# Patient Record
Sex: Female | Born: 1965 | Race: White | Hispanic: No | Marital: Married | Smoking: Never smoker
Health system: Southern US, Community
[De-identification: ages and names within clinical notes are randomized; demographics above are authoritative.]

## PROBLEM LIST (undated history)

## (undated) DIAGNOSIS — I10 Essential (primary) hypertension: Secondary | ICD-10-CM

## (undated) DIAGNOSIS — D68318 Other hemorrhagic disorder due to intrinsic circulating anticoagulants, antibodies, or inhibitors: Secondary | ICD-10-CM

## (undated) DIAGNOSIS — K146 Glossodynia: Secondary | ICD-10-CM

## (undated) HISTORY — DX: Other hemorrhagic disorder due to intrinsic circulating anticoagulants, antibodies, or inhibitors: D68.318

## (undated) HISTORY — DX: Essential (primary) hypertension: I10

## (undated) HISTORY — DX: Glossodynia: K14.6

---

## 2005-12-02 ENCOUNTER — Ambulatory Visit: Payer: Self-pay | Admitting: Unknown Physician Specialty

## 2007-03-01 HISTORY — PX: ABDOMINAL HYSTERECTOMY: SHX81

## 2007-04-01 ENCOUNTER — Ambulatory Visit: Payer: Self-pay | Admitting: Internal Medicine

## 2007-04-24 ENCOUNTER — Ambulatory Visit: Payer: Self-pay | Admitting: Unknown Physician Specialty

## 2007-04-27 ENCOUNTER — Ambulatory Visit: Payer: Self-pay | Admitting: Internal Medicine

## 2007-04-29 ENCOUNTER — Ambulatory Visit: Payer: Self-pay | Admitting: Internal Medicine

## 2007-05-01 ENCOUNTER — Inpatient Hospital Stay: Payer: Self-pay | Admitting: Unknown Physician Specialty

## 2007-05-05 ENCOUNTER — Observation Stay: Payer: Self-pay | Admitting: Internal Medicine

## 2007-05-30 ENCOUNTER — Ambulatory Visit: Payer: Self-pay | Admitting: Internal Medicine

## 2007-11-27 ENCOUNTER — Ambulatory Visit: Payer: Self-pay | Admitting: Unknown Physician Specialty

## 2008-12-29 ENCOUNTER — Ambulatory Visit: Payer: Self-pay | Admitting: Unknown Physician Specialty

## 2009-03-18 DIAGNOSIS — L989 Disorder of the skin and subcutaneous tissue, unspecified: Secondary | ICD-10-CM | POA: Insufficient documentation

## 2009-03-18 DIAGNOSIS — D6800 Von Willebrand disease, unspecified: Secondary | ICD-10-CM | POA: Insufficient documentation

## 2009-03-18 DIAGNOSIS — M25559 Pain in unspecified hip: Secondary | ICD-10-CM | POA: Insufficient documentation

## 2009-07-17 DIAGNOSIS — R5381 Other malaise: Secondary | ICD-10-CM | POA: Insufficient documentation

## 2009-07-23 DIAGNOSIS — E559 Vitamin D deficiency, unspecified: Secondary | ICD-10-CM | POA: Insufficient documentation

## 2009-07-31 DIAGNOSIS — F419 Anxiety disorder, unspecified: Secondary | ICD-10-CM | POA: Insufficient documentation

## 2009-12-31 ENCOUNTER — Ambulatory Visit: Payer: Self-pay | Admitting: Unknown Physician Specialty

## 2011-01-19 ENCOUNTER — Ambulatory Visit: Payer: Self-pay | Admitting: Unknown Physician Specialty

## 2015-10-06 DIAGNOSIS — R739 Hyperglycemia, unspecified: Secondary | ICD-10-CM | POA: Insufficient documentation

## 2016-10-06 ENCOUNTER — Other Ambulatory Visit: Payer: Self-pay | Admitting: Internal Medicine

## 2016-10-06 DIAGNOSIS — Z1231 Encounter for screening mammogram for malignant neoplasm of breast: Secondary | ICD-10-CM

## 2016-10-25 ENCOUNTER — Ambulatory Visit
Admission: RE | Admit: 2016-10-25 | Discharge: 2016-10-25 | Disposition: A | Discharge status: Home / Self Care | Payer: 59 | Source: Ambulatory Visit | Attending: Internal Medicine | Admitting: Internal Medicine

## 2016-10-25 DIAGNOSIS — Z1231 Encounter for screening mammogram for malignant neoplasm of breast: Secondary | ICD-10-CM | POA: Diagnosis present

## 2016-11-02 ENCOUNTER — Inpatient Hospital Stay
Admission: RE | Admit: 2016-11-02 | Discharge: 2016-11-02 | Disposition: A | Discharge status: Home / Self Care | Payer: Self-pay | Source: Ambulatory Visit | Attending: *Deleted | Admitting: *Deleted

## 2016-11-02 ENCOUNTER — Other Ambulatory Visit: Payer: Self-pay | Admitting: *Deleted

## 2016-11-02 DIAGNOSIS — Z9289 Personal history of other medical treatment: Secondary | ICD-10-CM

## 2017-12-04 ENCOUNTER — Other Ambulatory Visit: Payer: Self-pay | Admitting: Internal Medicine

## 2017-12-04 DIAGNOSIS — Z1231 Encounter for screening mammogram for malignant neoplasm of breast: Secondary | ICD-10-CM

## 2017-12-05 ENCOUNTER — Ambulatory Visit
Admission: RE | Admit: 2017-12-05 | Discharge: 2017-12-05 | Disposition: A | Discharge status: Home / Self Care | Payer: 59 | Source: Ambulatory Visit | Attending: Internal Medicine | Admitting: Internal Medicine

## 2017-12-05 DIAGNOSIS — Z1231 Encounter for screening mammogram for malignant neoplasm of breast: Secondary | ICD-10-CM | POA: Insufficient documentation

## 2018-11-28 ENCOUNTER — Other Ambulatory Visit: Payer: Self-pay | Admitting: Internal Medicine

## 2018-11-28 DIAGNOSIS — Z1231 Encounter for screening mammogram for malignant neoplasm of breast: Secondary | ICD-10-CM

## 2019-01-03 ENCOUNTER — Ambulatory Visit
Admission: RE | Admit: 2019-01-03 | Discharge: 2019-01-03 | Disposition: A | Discharge status: Home / Self Care | Payer: Managed Care, Other (non HMO) | Source: Ambulatory Visit | Attending: Internal Medicine | Admitting: Internal Medicine

## 2019-01-03 DIAGNOSIS — Z1231 Encounter for screening mammogram for malignant neoplasm of breast: Secondary | ICD-10-CM | POA: Insufficient documentation

## 2019-12-06 ENCOUNTER — Other Ambulatory Visit: Payer: Self-pay | Admitting: Internal Medicine

## 2019-12-06 DIAGNOSIS — Z1231 Encounter for screening mammogram for malignant neoplasm of breast: Secondary | ICD-10-CM

## 2020-01-01 ENCOUNTER — Inpatient Hospital Stay: Payer: Managed Care, Other (non HMO)

## 2020-01-01 ENCOUNTER — Inpatient Hospital Stay: Payer: Managed Care, Other (non HMO) | Attending: Oncology | Admitting: Oncology

## 2020-01-01 ENCOUNTER — Other Ambulatory Visit: Payer: Self-pay

## 2020-01-01 ENCOUNTER — Encounter: Payer: Self-pay | Admitting: Oncology

## 2020-01-01 DIAGNOSIS — Z79899 Other long term (current) drug therapy: Secondary | ICD-10-CM | POA: Insufficient documentation

## 2020-01-01 DIAGNOSIS — D751 Secondary polycythemia: Secondary | ICD-10-CM | POA: Insufficient documentation

## 2020-01-01 DIAGNOSIS — D680 Von Willebrand's disease: Secondary | ICD-10-CM | POA: Diagnosis not present

## 2020-01-01 DIAGNOSIS — Z803 Family history of malignant neoplasm of breast: Secondary | ICD-10-CM | POA: Insufficient documentation

## 2020-01-01 DIAGNOSIS — I10 Essential (primary) hypertension: Secondary | ICD-10-CM | POA: Insufficient documentation

## 2020-01-01 DIAGNOSIS — Z8041 Family history of malignant neoplasm of ovary: Secondary | ICD-10-CM | POA: Diagnosis not present

## 2020-01-01 LAB — CBC WITH DIFFERENTIAL/PLATELET
Abs Immature Granulocytes: 0.02 10*3/uL (ref 0.00–0.07)
Basophils Absolute: 0.1 10*3/uL (ref 0.0–0.1)
Basophils Relative: 1 %
Eosinophils Absolute: 0 10*3/uL (ref 0.0–0.5)
Eosinophils Relative: 0 %
HCT: 43.4 % (ref 36.0–46.0)
Hemoglobin: 15.7 g/dL — ABNORMAL HIGH (ref 12.0–15.0)
Immature Granulocytes: 0 %
Lymphocytes Relative: 31 %
Lymphs Abs: 2.1 10*3/uL (ref 0.7–4.0)
MCH: 32 pg (ref 26.0–34.0)
MCHC: 36.2 g/dL — ABNORMAL HIGH (ref 30.0–36.0)
MCV: 88.6 fL (ref 80.0–100.0)
Monocytes Absolute: 0.5 10*3/uL (ref 0.1–1.0)
Monocytes Relative: 7 %
Neutro Abs: 4.1 10*3/uL (ref 1.7–7.7)
Neutrophils Relative %: 61 %
Platelets: 184 10*3/uL (ref 150–400)
RBC: 4.9 MIL/uL (ref 3.87–5.11)
RDW: 11.9 % (ref 11.5–15.5)
WBC: 6.9 10*3/uL (ref 4.0–10.5)
nRBC: 0 % (ref 0.0–0.2)

## 2020-01-01 LAB — IRON AND TIBC
Iron: 99 ug/dL (ref 28–170)
Saturation Ratios: 34 % — ABNORMAL HIGH (ref 10.4–31.8)
TIBC: 290 ug/dL (ref 250–450)
UIBC: 191 ug/dL

## 2020-01-01 LAB — HEPATIC FUNCTION PANEL
ALT: 26 U/L (ref 0–44)
AST: 21 U/L (ref 15–41)
Albumin: 4.5 g/dL (ref 3.5–5.0)
Alkaline Phosphatase: 65 U/L (ref 38–126)
Bilirubin, Direct: 0.2 mg/dL (ref 0.0–0.2)
Indirect Bilirubin: 0.4 mg/dL (ref 0.3–0.9)
Total Bilirubin: 0.6 mg/dL (ref 0.3–1.2)
Total Protein: 8 g/dL (ref 6.5–8.1)

## 2020-01-01 LAB — HEPATITIS PANEL, ACUTE
HCV Ab: NONREACTIVE
Hep A IgM: NONREACTIVE
Hep B C IgM: NONREACTIVE
Hepatitis B Surface Ag: NONREACTIVE

## 2020-01-01 LAB — FERRITIN: Ferritin: 143 ng/mL (ref 11–307)

## 2020-01-01 NOTE — Progress Notes (Signed)
Hematology/Oncology Consult note [facility] Telephone:([phone] [phone]   Patient Care Team: Lynnea Ferrier, MD as PCP - General (Internal Medicine)  REFERRING PROVIDER: Lynnea Ferrier, MD  CHIEF COMPLAINTS/REASON FOR VISIT:  Evaluation of hereditary hemochromatosis  HISTORY OF PRESENTING ILLNESS:  Holly Mcclain is a 54 y.o. female who was seen in consultation at the request of Lynnea Ferrier, MD for evaluation of hemochromatosis Patient recent blood work showed elevated hemoglobin. 12/17/2019 hemochromatosis panel was ordered for work-up Results was positive for 2 copies of the C282Y mutation Ferritin 132, iron 144 patient was referred to establish care with hematology for further evaluation. She denies any alcohol use, skin rash, abdominal pain, shortness of breath, chest pain, leg swelling  Patient reports history of mild version of von Willebrand disease and was previously seen by Dr. Nelida Gores and was discharged.  Status post hysterectomy in 2009 Family history is positive for breast cancer and ovarian cancer in maternal grandmother.  No other family history of cancer.  Review of Systems  Constitutional: Negative for appetite change, chills, fatigue and fever.  HENT:   Negative for hearing loss and voice change.   Eyes: Negative for eye problems.  Respiratory: Negative for chest tightness and cough.   Cardiovascular: Negative for chest pain.  Gastrointestinal: Negative for abdominal distention, abdominal pain and blood in stool.  Endocrine: Negative for hot flashes.  Genitourinary: Negative for difficulty urinating and frequency.   Musculoskeletal: Negative for arthralgias.  Skin: Negative for itching and rash.  Neurological: Negative for extremity weakness.  Hematological: Negative for adenopathy.  Psychiatric/Behavioral: Negative for confusion.    MEDICAL HISTORY:  Past Medical History:  Diagnosis Date  . Burning mouth  syndrome   . Hypertension   . Von Willebrand factor inhibitor disorder (HCC)     SURGICAL HISTORY: Past Surgical History:  Procedure Laterality Date  . ABDOMINAL HYSTERECTOMY  2009    SOCIAL HISTORY: Social History   Socioeconomic History  . Marital status: Married    Spouse name: Not on file  . Number of children: Not on file  . Years of education: Not on file  . Highest education level: Not on file  Occupational History  . Not on file  Tobacco Use  . Smoking status: Never Smoker  . Smokeless tobacco: Never Used  Vaping Use  . Vaping Use: Never used  Substance and Sexual Activity  . Alcohol use: Never  . Drug use: Never  . Sexual activity: Yes  Other Topics Concern  . Not on file  Social History Narrative  . Not on file   Social Determinants of Health   Financial Resource Strain:   . Difficulty of Paying Living Expenses: Not on file  Food Insecurity:   . Worried About Programme researcher, broadcasting/film/video in the Last Year: Not on file  . Ran Out of Food in the Last Year: Not on file  Transportation Needs:   . Lack of Transportation (Medical): Not on file  . Lack of Transportation (Non-Medical): Not on file  Physical Activity:   . Days of Exercise per Week: Not on file  . Minutes of Exercise per Session: Not on file  Stress:   . Feeling of Stress : Not on file  Social Connections:   . Frequency of Communication with Friends and Family: Not on file  . Frequency of Social Gatherings with Friends and Family: Not on file  . Attends Religious Services: Not on file  .  Active Member of Clubs or Organizations: Not on file  . Attends Banker Meetings: Not on file  . Marital Status: Not on file  Intimate Partner Violence:   . Fear of Current or Ex-Partner: Not on file  . Emotionally Abused: Not on file  . Physically Abused: Not on file  . Sexually Abused: Not on file    FAMILY HISTORY: Family History  Problem Relation Age of Onset  . Breast cancer Maternal  Grandmother        33  . Ovarian cancer Maternal Grandmother     ALLERGIES:  is allergic to aspirin.  MEDICATIONS:  Current Outpatient Medications  Medication Sig Dispense Refill  . acetaminophen (TYLENOL) 500 MG tablet Take by mouth every 6 (six) hours as needed.     . fluticasone (FLONASE) 50 MCG/ACT nasal spray Place into the nose daily as needed.     . loratadine (CLARITIN) 10 MG tablet Take by mouth daily as needed.     . triamterene-hydrochlorothiazide (MAXZIDE-25) 37.5-25 MG tablet Take 1 tablet by mouth daily.     No current facility-administered medications for this visit.     PHYSICAL EXAMINATION: ECOG PERFORMANCE STATUS: 0 - Asymptomatic Vitals:   01/01/20 1040  BP: (!) 148/84  Pulse: 77  Temp: (!) 97 F (36.1 C)  SpO2: 98%   Filed Weights   01/01/20 1040  Weight: 173 lb 4.8 oz (78.6 kg)    Physical Exam Constitutional:      General: She is not in acute distress. HENT:     Head: Normocephalic and atraumatic.  Eyes:     General: No scleral icterus. Cardiovascular:     Rate and Rhythm: Normal rate and regular rhythm.     Heart sounds: Normal heart sounds.  Pulmonary:     Effort: Pulmonary effort is normal. No respiratory distress.     Breath sounds: No wheezing.  Abdominal:     General: Bowel sounds are normal. There is no distension.     Palpations: Abdomen is soft.  Musculoskeletal:        General: No deformity. Normal range of motion.     Cervical back: Normal range of motion and neck supple.  Skin:    General: Skin is warm and dry.     Findings: No erythema or rash.  Neurological:     Mental Status: She is alert and oriented to person, place, and time. Mental status is at baseline.     Cranial Nerves: No cranial nerve deficit.     Coordination: Coordination normal.  Psychiatric:        Mood and Affect: Mood normal.      LABORATORY DATA:  I have reviewed the data as listed Lab Results  Component Value Date   WBC 6.9 01/01/2020   HGB  15.7 (H) 01/01/2020   HCT 43.4 01/01/2020   MCV 88.6 01/01/2020   PLT 184 01/01/2020   Recent Labs    01/01/20 1142  PROT 8.0  ALBUMIN 4.5  AST 21  ALT 26  ALKPHOS 65  BILITOT 0.6  BILIDIR 0.2  IBILI 0.4   Iron/TIBC/Ferritin/ %Sat    Component Value Date/Time   IRON 99 01/01/2020 1142   TIBC 290 01/01/2020 1142   FERRITIN 143 01/01/2020 1142   IRONPCTSAT 34 (H) 01/01/2020 1142      RADIOGRAPHIC STUDIES: I have personally reviewed the radiological images as listed and agreed with the findings in the report.  No results found.   ASSESSMENT & PLAN:  1. Hereditary hemochromatosis (HCC)   2. Erythrocytosis    Diagnosis of hereditary hemochromatosis discussed with patient.  Advised patient to have first-degree relative screening for hemochromatosis.  Avoid alcohol consumption.  I will check hepatitis, repeat iron panel Recent ferritin is less than 500.  No transaminitis or elevated bilirubin.  I will hold off phlebotomy at this point. Check ultrasound abdomen right upper quadrant Monitor regularly  Erythrocytosis, check JAK2 mutation with reflex.  Addendum- Labs are reviewed and discussed with patient. JAK2 V617F mutation negative, with reflex to other mutations CALR, MPL, JAK 2 Ex 12-15 mutations negative. Erythrocytosis is most likely secondary process. Recommend sleep apnea evaluation.  Iron panel showed slightly /isolate increase of iron saturation. Ferritin level is 143. - monitor.   US findings are normal.  Follow up virtual visit to go over lab results.   Orders Placed This Encounter  Procedures  . US Abdomen Limited RUQ (LIVER/GB)    Standing Status:   Future    Standing Expiration Date:   12/31/2020    Order Specific Question:   Reason for Exam (SYMPTOM  OR DIAGNOSIS REQUIRED)    Answer:   hemochromatosis    Order Specific Question:   Preferred imaging location?    Answer:   [facility]  . CBC with Differential/Platelet    Standing Status:    Future    Number of Occurrences:   1    Standing Expiration Date:   12/31/2020  . Ferritin    Standing Status:   Future    Number of Occurrences:   1    Standing Expiration Date:   06/30/2020  . Iron and TIBC    Standing Status:   Future    Number of Occurrences:   1    Standing Expiration Date:   12/31/2020  . JAK2 V617F, w Reflex to CALR/E12/MPL    Standing Status:   Future    Number of Occurrences:   1    Standing Expiration Date:   12/31/2020  . Hepatitis panel, acute    Standing Status:   Future    Number of Occurrences:   1    Standing Expiration Date:   12/31/2020  . Hepatic function panel    Standing Status:   Future    Number of Occurrences:   1    Standing Expiration Date:   12/31/2020    All questions were answered. The patient knows to call the clinic with any problems questions or concerns.  Cc Lynnea Ferrier, MD   Thank you for this kind referral and the opportunity to participate in the care of this patient. A copy of today's note is routed to referring provider     Rickard Patience, MD, PhD Hematology Oncology [facility] Pager- [phone] 01/01/2020

## 2020-01-01 NOTE — Progress Notes (Signed)
New patient evaluation.   

## 2020-01-07 ENCOUNTER — Ambulatory Visit
Admission: RE | Admit: 2020-01-07 | Discharge: 2020-01-07 | Disposition: A | Discharge status: Home / Self Care | Payer: Managed Care, Other (non HMO) | Source: Ambulatory Visit | Attending: Oncology | Admitting: Oncology

## 2020-01-07 ENCOUNTER — Other Ambulatory Visit: Payer: Self-pay

## 2020-01-08 ENCOUNTER — Telehealth: Payer: Self-pay

## 2020-01-08 ENCOUNTER — Ambulatory Visit
Admission: RE | Admit: 2020-01-08 | Discharge: 2020-01-08 | Disposition: A | Discharge status: Home / Self Care | Payer: Managed Care, Other (non HMO) | Source: Ambulatory Visit | Attending: Internal Medicine | Admitting: Internal Medicine

## 2020-01-08 DIAGNOSIS — Z1231 Encounter for screening mammogram for malignant neoplasm of breast: Secondary | ICD-10-CM | POA: Diagnosis not present

## 2020-01-08 NOTE — Telephone Encounter (Signed)
Mychart message sent to pt notifying her of lab and ultrasound results.

## 2020-01-08 NOTE — Telephone Encounter (Signed)
-----   Message from Rickard Patience, MD sent at 01/08/2020  2:46 PM EST ----- Please let her know that ultrasound showed normal liver echogenicity.  No signs of cirrhosis. Iron lab showed normal ferritin, slightly elevated iron saturation.  I recommend no phlebotomy and observation for now and repeat blood work in 3 months. Please arrange for her to follow-up with labs, CBC, iron labs prior.  Please order.  MD virtually.  Thank you

## 2020-01-09 NOTE — Telephone Encounter (Signed)
Done... Lab prior in 71mths/MyChart visit appts has been  scheduled As requested. Pt was made aware.

## 2020-01-19 LAB — CALR + JAK2 E12-15 + MPL (REFLEXED)

## 2020-01-19 LAB — JAK2 V617F, W REFLEX TO CALR/E12/MPL

## 2020-01-21 ENCOUNTER — Telehealth: Payer: Self-pay

## 2020-01-21 NOTE — Telephone Encounter (Signed)
Please call pt to schedule Mychart visit one day next week.

## 2020-01-21 NOTE — Telephone Encounter (Signed)
Done... Pt has been sched for MyChart the week of 11/29 As requested. Pt is aware.

## 2020-01-21 NOTE — Telephone Encounter (Signed)
-----   Message from Rickard Patience, MD sent at 01/21/2020  1:30 AM EST ----- All labs come back. Please arrange her to have a virtual visit visit with me next week. Thanks.

## 2020-01-31 ENCOUNTER — Inpatient Hospital Stay: Payer: Managed Care, Other (non HMO) | Attending: Oncology | Admitting: Oncology

## 2020-01-31 ENCOUNTER — Encounter: Payer: Self-pay | Admitting: Oncology

## 2020-01-31 DIAGNOSIS — D751 Secondary polycythemia: Secondary | ICD-10-CM | POA: Diagnosis not present

## 2020-01-31 NOTE — Progress Notes (Signed)
Patient verified using two identifiers for virtual visit via telephone today.  Patient denies new problems/concerns today.   

## 2020-02-01 NOTE — Progress Notes (Signed)
HEMATOLOGY-ONCOLOGY TeleHEALTH VISIT PROGRESS NOTE  I connected with Holly Mcclain on 02/01/20 at  2:45 PM EST by video enabled telemedicine visit and verified that I am speaking with the correct person using two identifiers. I discussed the limitations, risks, security and privacy concerns of performing an evaluation and management service by telemedicine and the availability of in-person appointments. I also discussed with the patient that there may be a patient responsible charge related to this service. The patient expressed understanding and agreed to proceed.   Other persons participating in the visit and their role in the encounter:  None  Patient's location: Home  Provider's location: office Chief Complaint: hereditary hemochromatosis, erythrocytosis   INTERVAL HISTORY Holly Mcclain is a 54 y.o. female who has above history reviewed by me today presents for follow up visit for management of hereditary hemochromatosis/erythrocytosis Problems and complaints are listed below:  She had blood word done and presents virtually to discuss results.  No new complaints  Review of Systems  Constitutional: Negative for appetite change, chills, fatigue and fever.  HENT:   Negative for hearing loss and voice change.   Eyes: Negative for eye problems.  Respiratory: Negative for chest tightness and cough.   Cardiovascular: Negative for chest pain.  Gastrointestinal: Negative for abdominal distention, abdominal pain and blood in stool.  Endocrine: Negative for hot flashes.  Genitourinary: Negative for difficulty urinating and frequency.   Musculoskeletal: Negative for arthralgias.  Skin: Negative for itching and rash.  Neurological: Negative for extremity weakness.  Hematological: Negative for adenopathy.  Psychiatric/Behavioral: Negative for confusion.    Past Medical History:  Diagnosis Date  . Burning mouth syndrome   . Hypertension   . Von Willebrand factor inhibitor disorder [facility])     Past Surgical History:  Procedure Laterality Date  . ABDOMINAL HYSTERECTOMY  2009    Family History  Problem Relation Age of Onset  . Breast cancer Maternal Grandmother        1  . Ovarian cancer Maternal Grandmother   . Lymphoma Maternal Grandfather     Social History   Socioeconomic History  . Marital status: Married    Spouse name: Not on file  . Number of children: Not on file  . Years of education: Not on file  . Highest education level: Not on file  Occupational History  . Not on file  Tobacco Use  . Smoking status: Never Smoker  . Smokeless tobacco: Never Used  Vaping Use  . Vaping Use: Never used  Substance and Sexual Activity  . Alcohol use: Never  . Drug use: Never  . Sexual activity: Yes  Other Topics Concern  . Not on file  Social History Narrative  . Not on file   Social Determinants of Health   Financial Resource Strain:   . Difficulty of Paying Living Expenses: Not on file  Food Insecurity:   . Worried About Programme researcher, broadcasting/film/video in the Last Year: Not on file  . Ran Out of Food in the Last Year: Not on file  Transportation Needs:   . Lack of Transportation (Medical): Not on file  . Lack of Transportation (Non-Medical): Not on file  Physical Activity:   . Days of Exercise per Week: Not on file  . Minutes of Exercise per Session: Not on file  Stress:   . Feeling of Stress : Not on file  Social Connections:   . Frequency of Communication with Friends and Family: Not on file  . Frequency of  Social Gatherings with Friends and Family: Not on file  . Attends Religious Services: Not on file  . Active Member of Clubs or Organizations: Not on file  . Attends Banker Meetings: Not on file  . Marital Status: Not on file  Intimate Partner Violence:   . Fear of Current or Ex-Partner: Not on file  . Emotionally Abused: Not on file  . Physically Abused: Not on file  . Sexually Abused: Not on file    Current Outpatient Medications on File  Prior to Visit  Medication Sig Dispense Refill  . acetaminophen (TYLENOL) 500 MG tablet Take by mouth every 6 (six) hours as needed.     . fluticasone (FLONASE) 50 MCG/ACT nasal spray Place into the nose daily as needed.     . loratadine (CLARITIN) 10 MG tablet Take by mouth daily as needed.     . triamterene-hydrochlorothiazide (MAXZIDE-25) 37.5-25 MG tablet Take 1 tablet by mouth daily.     No current facility-administered medications on file prior to visit.    Allergies  Allergen Reactions  . Aspirin Other (See Comments)    Vonwillebrands Disease       Observations/Objective: Today's Vitals   01/31/20 1333  PainSc: 0-No pain   There is no height or weight on file to calculate BMI.  Physical Exam Neurological:     Mental Status: She is alert.     CBC    Component Value Date/Time   WBC 6.9 01/01/2020 1142   RBC 4.90 01/01/2020 1142   HGB 15.7 (H) 01/01/2020 1142   HCT 43.4 01/01/2020 1142   PLT 184 01/01/2020 1142   MCV 88.6 01/01/2020 1142   MCH 32.0 01/01/2020 1142   MCHC 36.2 (H) 01/01/2020 1142   RDW 11.9 01/01/2020 1142   LYMPHSABS 2.1 01/01/2020 1142   MONOABS 0.5 01/01/2020 1142   EOSABS 0.0 01/01/2020 1142   BASOSABS 0.1 01/01/2020 1142    CMP     Component Value Date/Time   PROT 8.0 01/01/2020 1142   ALBUMIN 4.5 01/01/2020 1142   AST 21 01/01/2020 1142   ALT 26 01/01/2020 1142   ALKPHOS 65 01/01/2020 1142   BILITOT 0.6 01/01/2020 1142     Assessment and Plan: 1. Erythrocytosis   2. Hereditary hemochromatosis (HCC)     #Hereditary hemochromatosis prognosis was discussed with patient.  Advise first-degree relatives screening for hemochromatosis Labs reviewed and discussed with patient.  Patient has a ferritin level of 143, slightly increased iron saturation. Normal liver function testing.  Ultrasound liver is normal.  I recommend observation for now, hold off phlebotomy.  #Erythrocytosis, JAK2 V617F mutation negative, with reflex to other  mutations CALR, MPL, JAK 2 Ex 12-15 mutations negative. Suspect secondary etiology.  Recommend patient to talk to primary care provider and have sleep apnea evaluation. Currently HCT is less than 50.  No need for urgent phlebotomy.  Follow Up Instructions: Keep currently scheduled appointment   I discussed the assessment and treatment plan with the patient. The patient was provided an opportunity to ask questions and all were answered. The patient agreed with the plan and demonstrated an understanding of the instructions.  The patient was advised to call back or seek an in-person evaluation if the symptoms worsen or if the condition fails to improve as anticipated.    Rickard Patience, MD 02/01/2020 12:09 PM

## 2020-04-03 ENCOUNTER — Inpatient Hospital Stay: Payer: Managed Care, Other (non HMO) | Attending: Oncology

## 2020-04-03 LAB — CBC WITH DIFFERENTIAL/PLATELET
Abs Immature Granulocytes: 0.03 10*3/uL (ref 0.00–0.07)
Basophils Absolute: 0 10*3/uL (ref 0.0–0.1)
Basophils Relative: 1 %
Eosinophils Absolute: 0 10*3/uL (ref 0.0–0.5)
Eosinophils Relative: 1 %
HCT: 38.9 % (ref 36.0–46.0)
Hemoglobin: 14.3 g/dL (ref 12.0–15.0)
Immature Granulocytes: 1 %
Lymphocytes Relative: 27 %
Lymphs Abs: 1.8 10*3/uL (ref 0.7–4.0)
MCH: 32.4 pg (ref 26.0–34.0)
MCHC: 36.8 g/dL — ABNORMAL HIGH (ref 30.0–36.0)
MCV: 88 fL (ref 80.0–100.0)
Monocytes Absolute: 0.5 10*3/uL (ref 0.1–1.0)
Monocytes Relative: 7 %
Neutro Abs: 4.1 10*3/uL (ref 1.7–7.7)
Neutrophils Relative %: 63 %
Platelets: 164 10*3/uL (ref 150–400)
RBC: 4.42 MIL/uL (ref 3.87–5.11)
RDW: 12.1 % (ref 11.5–15.5)
WBC: 6.4 10*3/uL (ref 4.0–10.5)
nRBC: 0 % (ref 0.0–0.2)

## 2020-04-03 LAB — IRON AND TIBC
Iron: 98 ug/dL (ref 28–170)
Saturation Ratios: 37 % — ABNORMAL HIGH (ref 10.4–31.8)
TIBC: 267 ug/dL (ref 250–450)
UIBC: 169 ug/dL

## 2020-04-03 LAB — FERRITIN: Ferritin: 171 ng/mL (ref 11–307)

## 2020-04-07 ENCOUNTER — Inpatient Hospital Stay (HOSPITAL_BASED_OUTPATIENT_CLINIC_OR_DEPARTMENT_OTHER): Payer: Managed Care, Other (non HMO) | Admitting: Oncology

## 2020-04-07 ENCOUNTER — Encounter: Payer: Self-pay | Admitting: Oncology

## 2020-04-07 DIAGNOSIS — D751 Secondary polycythemia: Secondary | ICD-10-CM | POA: Diagnosis not present

## 2020-04-07 NOTE — Progress Notes (Signed)
HEMATOLOGY-ONCOLOGY TeleHEALTH VISIT PROGRESS NOTE  I connected with Holly Mcclain on 04/07/20 at  2:15 PM EST by video enabled telemedicine visit and verified that I am speaking with the correct person using two identifiers. I discussed the limitations, risks, security and privacy concerns of performing an evaluation and management service by telemedicine and the availability of in-person appointments. I also discussed with the patient that there may be a patient responsible charge related to this service. The patient expressed understanding and agreed to proceed.   Other persons participating in the visit and their role in the encounter:  None  Patient's location: Home  Provider's location: office Chief Complaint: hereditary hemochromatosis, erythrocytosis   INTERVAL HISTORY Holly Mcclain is a 55 y.o. female who has above history reviewed by me today presents for follow up visit for management of hereditary hemochromatosis/erythrocytosis Problems and complaints are listed below:  She had blood word done and presents virtually to discuss results.  Patient has no new complaints.  She has not seen primary care provider yet to discuss about sleep study.  Review of Systems  Constitutional: Negative for appetite change, chills, fatigue and fever.  HENT:   Negative for hearing loss and voice change.   Eyes: Negative for eye problems.  Respiratory: Negative for chest tightness and cough.   Cardiovascular: Negative for chest pain.  Gastrointestinal: Negative for abdominal distention, abdominal pain and blood in stool.  Endocrine: Negative for hot flashes.  Genitourinary: Negative for difficulty urinating and frequency.   Musculoskeletal: Negative for arthralgias.  Skin: Negative for itching and rash.  Neurological: Negative for extremity weakness.  Hematological: Negative for adenopathy.  Psychiatric/Behavioral: Negative for confusion.    Past Medical History:  Diagnosis Date  . Burning  mouth syndrome   . Hypertension   . Von Willebrand factor inhibitor disorder [facility])    Past Surgical History:  Procedure Laterality Date  . ABDOMINAL HYSTERECTOMY  2009    Family History  Problem Relation Age of Onset  . Breast cancer Maternal Grandmother        12  . Ovarian cancer Maternal Grandmother   . Lymphoma Maternal Grandfather     Social History   Socioeconomic History  . Marital status: Married    Spouse name: Not on file  . Number of children: Not on file  . Years of education: Not on file  . Highest education level: Not on file  Occupational History  . Not on file  Tobacco Use  . Smoking status: Never Smoker  . Smokeless tobacco: Never Used  Vaping Use  . Vaping Use: Never used  Substance and Sexual Activity  . Alcohol use: Never  . Drug use: Never  . Sexual activity: Yes  Other Topics Concern  . Not on file  Social History Narrative  . Not on file   Social Determinants of Health   Financial Resource Strain: Not on file  Food Insecurity: Not on file  Transportation Needs: Not on file  Physical Activity: Not on file  Stress: Not on file  Social Connections: Not on file  Intimate Partner Violence: Not on file    Current Outpatient Medications on File Prior to Visit  Medication Sig Dispense Refill  . acetaminophen (TYLENOL) 500 MG tablet Take by mouth every 6 (six) hours as needed.     . fluticasone (FLONASE) 50 MCG/ACT nasal spray Place into the nose daily as needed.     . loratadine (CLARITIN) 10 MG tablet Take by mouth daily as needed.     Marland Kitchen  triamterene-hydrochlorothiazide (MAXZIDE-25) 37.5-25 MG tablet Take 1 tablet by mouth daily.     No current facility-administered medications on file prior to visit.    Allergies  Allergen Reactions  . Aspirin Other (See Comments)    Vonwillebrands Disease       Observations/Objective: Today's Vitals   04/07/20 1337  PainSc: 0-No pain   There is no height or weight on file to calculate BMI.   Physical Exam Neurological:     Mental Status: She is alert.     CBC    Component Value Date/Time   WBC 6.4 04/03/2020 1109   RBC 4.42 04/03/2020 1109   HGB 14.3 04/03/2020 1109   HCT 38.9 04/03/2020 1109   PLT 164 04/03/2020 1109   MCV 88.0 04/03/2020 1109   MCH 32.4 04/03/2020 1109   MCHC 36.8 (H) 04/03/2020 1109   RDW 12.1 04/03/2020 1109   LYMPHSABS 1.8 04/03/2020 1109   MONOABS 0.5 04/03/2020 1109   EOSABS 0.0 04/03/2020 1109   BASOSABS 0.0 04/03/2020 1109    CMP     Component Value Date/Time   PROT 8.0 01/01/2020 1142   ALBUMIN 4.5 01/01/2020 1142   AST 21 01/01/2020 1142   ALT 26 01/01/2020 1142   ALKPHOS 65 01/01/2020 1142   BILITOT 0.6 01/01/2020 1142     Assessment and Plan: 1. Erythrocytosis   2. Hereditary hemochromatosis (HCC)     #Hereditary hemochromatosis prognosis was discussed with patient.  Advise first-degree relatives screening for hemochromatosis Labs reviewed and discussed with patient. Patient has slightly increased iron saturation of 37, ferritin slightly increased than last visit.  Given that patient has normal liver function, ultrasound, isolated increased saturation ferritin is less than 500, recommend continue observation.  Patient agrees with plan.  She also prefers to be observed rather than phlebotomy now.  Recommend patient to avoid high iron content/and high vitamin C food.  Avoid alcohol.  #Erythrocytosis, JAK2 V617F mutation negative, with reflex to other mutations CALR, MPL, JAK 2 Ex 12-15 mutations negative. Suspect secondary etiology.  Recommend patient to talk to primary care provider and have sleep apnea evaluation.  Today hemoglobin improved to 14.3.  Follow Up Instructions: Follow-up in 6 months.   I discussed the assessment and treatment plan with the patient. The patient was provided an opportunity to ask questions and all were answered. The patient agreed with the plan and demonstrated an understanding of the  instructions.  The patient was advised to call back or seek an in-person evaluation if the symptoms worsen or if the condition fails to improve as anticipated.    Rickard Patience, MD 04/07/2020 8:27 PM

## 2020-04-07 NOTE — Progress Notes (Signed)
Patient denies new problems/concerns today.   °

## 2020-08-03 DIAGNOSIS — G4733 Obstructive sleep apnea (adult) (pediatric): Secondary | ICD-10-CM | POA: Insufficient documentation

## 2020-10-02 ENCOUNTER — Inpatient Hospital Stay: Payer: Managed Care, Other (non HMO) | Attending: Oncology

## 2020-10-02 ENCOUNTER — Other Ambulatory Visit: Payer: Self-pay

## 2020-10-02 DIAGNOSIS — Z90710 Acquired absence of both cervix and uterus: Secondary | ICD-10-CM | POA: Diagnosis not present

## 2020-10-02 DIAGNOSIS — Z803 Family history of malignant neoplasm of breast: Secondary | ICD-10-CM | POA: Insufficient documentation

## 2020-10-02 DIAGNOSIS — D751 Secondary polycythemia: Secondary | ICD-10-CM | POA: Diagnosis not present

## 2020-10-02 DIAGNOSIS — Z8041 Family history of malignant neoplasm of ovary: Secondary | ICD-10-CM | POA: Diagnosis not present

## 2020-10-02 DIAGNOSIS — Z807 Family history of other malignant neoplasms of lymphoid, hematopoietic and related tissues: Secondary | ICD-10-CM | POA: Diagnosis not present

## 2020-10-02 LAB — HEPATIC FUNCTION PANEL
ALT: 17 U/L (ref 0–44)
AST: 17 U/L (ref 15–41)
Albumin: 4.4 g/dL (ref 3.5–5.0)
Alkaline Phosphatase: 63 U/L (ref 38–126)
Bilirubin, Direct: <0.1 mg/dL (ref 0.0–0.2)
Total Bilirubin: 0.5 mg/dL (ref 0.3–1.2)
Total Protein: 7.2 g/dL (ref 6.5–8.1)

## 2020-10-02 LAB — CBC WITH DIFFERENTIAL/PLATELET
Abs Immature Granulocytes: 0.02 10*3/uL (ref 0.00–0.07)
Basophils Absolute: 0.1 10*3/uL (ref 0.0–0.1)
Basophils Relative: 1 %
Eosinophils Absolute: 0.1 10*3/uL (ref 0.0–0.5)
Eosinophils Relative: 1 %
HCT: 40.7 % (ref 36.0–46.0)
Hemoglobin: 14.5 g/dL (ref 12.0–15.0)
Immature Granulocytes: 0 %
Lymphocytes Relative: 24 %
Lymphs Abs: 2 10*3/uL (ref 0.7–4.0)
MCH: 32 pg (ref 26.0–34.0)
MCHC: 35.6 g/dL (ref 30.0–36.0)
MCV: 89.8 fL (ref 80.0–100.0)
Monocytes Absolute: 0.6 10*3/uL (ref 0.1–1.0)
Monocytes Relative: 8 %
Neutro Abs: 5.4 10*3/uL (ref 1.7–7.7)
Neutrophils Relative %: 66 %
Platelets: 175 10*3/uL (ref 150–400)
RBC: 4.53 MIL/uL (ref 3.87–5.11)
RDW: 12.2 % (ref 11.5–15.5)
WBC: 8.2 10*3/uL (ref 4.0–10.5)
nRBC: 0 % (ref 0.0–0.2)

## 2020-10-02 LAB — IRON AND TIBC
Iron: 83 ug/dL (ref 28–170)
Saturation Ratios: 30 % (ref 10.4–31.8)
TIBC: 276 ug/dL (ref 250–450)
UIBC: 193 ug/dL

## 2020-10-02 LAB — FERRITIN: Ferritin: 136 ng/mL (ref 11–307)

## 2020-10-05 ENCOUNTER — Inpatient Hospital Stay (HOSPITAL_BASED_OUTPATIENT_CLINIC_OR_DEPARTMENT_OTHER): Payer: Managed Care, Other (non HMO) | Admitting: Oncology

## 2020-10-05 ENCOUNTER — Other Ambulatory Visit: Payer: Self-pay

## 2020-10-05 ENCOUNTER — Encounter: Payer: Self-pay | Admitting: Oncology

## 2020-10-05 DIAGNOSIS — N390 Urinary tract infection, site not specified: Secondary | ICD-10-CM | POA: Insufficient documentation

## 2020-10-05 DIAGNOSIS — K146 Glossodynia: Secondary | ICD-10-CM | POA: Insufficient documentation

## 2020-10-05 DIAGNOSIS — D68318 Other hemorrhagic disorder due to intrinsic circulating anticoagulants, antibodies, or inhibitors: Secondary | ICD-10-CM | POA: Insufficient documentation

## 2020-10-05 DIAGNOSIS — I10 Essential (primary) hypertension: Secondary | ICD-10-CM | POA: Insufficient documentation

## 2020-10-05 DIAGNOSIS — D751 Secondary polycythemia: Secondary | ICD-10-CM | POA: Diagnosis not present

## 2020-10-05 DIAGNOSIS — Z23 Encounter for immunization: Secondary | ICD-10-CM | POA: Insufficient documentation

## 2020-10-05 DIAGNOSIS — D649 Anemia, unspecified: Secondary | ICD-10-CM | POA: Insufficient documentation

## 2020-10-05 DIAGNOSIS — Z8669 Personal history of other diseases of the nervous system and sense organs: Secondary | ICD-10-CM | POA: Insufficient documentation

## 2020-10-05 DIAGNOSIS — Z0000 Encounter for general adult medical examination without abnormal findings: Secondary | ICD-10-CM | POA: Insufficient documentation

## 2020-10-05 NOTE — Progress Notes (Signed)
HEMATOLOGY-ONCOLOGY TeleHEALTH VISIT PROGRESS NOTE  I connected with Holly Mcclain on 10/05/20 at  2:00 PM EDT by video enabled telemedicine visit and verified that I am speaking with the correct person using two identifiers. I discussed the limitations, risks, security and privacy concerns of performing an evaluation and management service by telemedicine and the availability of in-person appointments. I also discussed with the patient that there may be a patient responsible charge related to this service. The patient expressed understanding and agreed to proceed.   Other persons participating in the visit and their role in the encounter:  None  Patient's location: Home  Provider's location: office Chief Complaint: hereditary hemochromatosis, erythrocytosis   INTERVAL HISTORY Holly Mcclain is a 55 y.o. female who has above history reviewed by me today presents for follow up visit for management of hereditary hemochromatosis/erythrocytosis Problems and complaints are listed below:  She reports no new complaints.  Doing well. Review of Systems  Constitutional:  Negative for appetite change, chills, fatigue and fever.  HENT:   Negative for hearing loss and voice change.   Eyes:  Negative for eye problems.  Respiratory:  Negative for chest tightness and cough.   Cardiovascular:  Negative for chest pain.  Gastrointestinal:  Negative for abdominal distention, abdominal pain and blood in stool.  Endocrine: Negative for hot flashes.  Genitourinary:  Negative for difficulty urinating and frequency.   Musculoskeletal:  Negative for arthralgias.  Skin:  Negative for itching and rash.  Neurological:  Negative for extremity weakness.  Hematological:  Negative for adenopathy.  Psychiatric/Behavioral:  Negative for confusion.    Past Medical History:  Diagnosis Date   Burning mouth syndrome    Hypertension    Von Willebrand factor inhibitor disorder [facility])    Past Surgical History:  Procedure  Laterality Date   ABDOMINAL HYSTERECTOMY  2009    Family History  Problem Relation Age of Onset   Breast cancer Maternal Grandmother        13   Ovarian cancer Maternal Grandmother    Lymphoma Maternal Grandfather     Social History   Socioeconomic History   Marital status: Married    Spouse name: Not on file   Number of children: Not on file   Years of education: Not on file   Highest education level: Not on file  Occupational History   Not on file  Tobacco Use   Smoking status: Never   Smokeless tobacco: Never  Vaping Use   Vaping Use: Never used  Substance and Sexual Activity   Alcohol use: Never   Drug use: Never   Sexual activity: Yes  Other Topics Concern   Not on file  Social History Narrative   Not on file   Social Determinants of Health   Financial Resource Strain: Not on file  Food Insecurity: Not on file  Transportation Needs: Not on file  Physical Activity: Not on file  Stress: Not on file  Social Connections: Not on file  Intimate Partner Violence: Not on file    Current Outpatient Medications on File Prior to Visit  Medication Sig Dispense Refill   acetaminophen (TYLENOL) 500 MG tablet Take by mouth every 6 (six) hours as needed.      loratadine (CLARITIN) 10 MG tablet Take by mouth daily as needed.      triamterene-hydrochlorothiazide (MAXZIDE-25) 37.5-25 MG tablet Take 1 tablet by mouth daily.     fluticasone (FLONASE) 50 MCG/ACT nasal spray Place into the nose daily as needed.  (  Patient not taking: Reported on 10/05/2020)     No current facility-administered medications on file prior to visit.    Allergies  Allergen Reactions   Aspirin Other (See Comments)    Vonwillebrands Disease       Observations/Objective: Today's Vitals   10/05/20 1341  PainSc: 0-No pain   There is no height or weight on file to calculate BMI.  Physical Exam Neurological:     Mental Status: She is alert.    CBC    Component Value Date/Time   WBC 8.2  10/02/2020 1429   RBC 4.53 10/02/2020 1429   HGB 14.5 10/02/2020 1429   HCT 40.7 10/02/2020 1429   PLT 175 10/02/2020 1429   MCV 89.8 10/02/2020 1429   MCH 32.0 10/02/2020 1429   MCHC 35.6 10/02/2020 1429   RDW 12.2 10/02/2020 1429   LYMPHSABS 2.0 10/02/2020 1429   MONOABS 0.6 10/02/2020 1429   EOSABS 0.1 10/02/2020 1429   BASOSABS 0.1 10/02/2020 1429    CMP     Component Value Date/Time   PROT 7.2 10/02/2020 1429   ALBUMIN 4.4 10/02/2020 1429   AST 17 10/02/2020 1429   ALT 17 10/02/2020 1429   ALKPHOS 63 10/02/2020 1429   BILITOT 0.5 10/02/2020 1429    12/17/2019 hemochromatosis panel 2 copies of the C282Y mutation Assessment and Plan: 1. Hereditary hemochromatosis (HCC)   2. Erythrocytosis     #Hereditary hemochromatosis, homozygous C282Y mutation. Patient is asymptomatic Labs are reviewed and discussed with patient. Iron panel is stable.  Iron saturation 30, ferritin 136. Patient has normal liver function. I recommend continue expectant management/observation. Avoid oral iron supplementation, vitamin C rich food, avoid alcohol.  #Erythrocytosis, JAK2 V617F mutation negative, with reflex to other mutations CALR, MPL, JAK 2 Ex 12-15 mutations negative. Hemoglobin remained stable currently.  Hemoglobin 14.5.  Follow Up Instructions: Follow-up in 12 months.   I discussed the assessment and treatment plan with the patient. The patient was provided an opportunity to ask questions and all were answered. The patient agreed with the plan and demonstrated an understanding of the instructions.  The patient was advised to call back or seek an in-person evaluation if the symptoms worsen or if the condition fails to improve as anticipated.    Rickard Patience, MD 10/05/2020 8:45 PM

## 2020-10-05 NOTE — Progress Notes (Signed)
No new concerns today 

## 2021-02-25 ENCOUNTER — Other Ambulatory Visit: Payer: Self-pay | Admitting: Internal Medicine

## 2021-02-25 DIAGNOSIS — Z1231 Encounter for screening mammogram for malignant neoplasm of breast: Secondary | ICD-10-CM

## 2021-04-05 ENCOUNTER — Other Ambulatory Visit: Payer: Self-pay

## 2021-04-05 ENCOUNTER — Ambulatory Visit
Admission: RE | Admit: 2021-04-05 | Discharge: 2021-04-05 | Disposition: A | Discharge status: Home / Self Care | Payer: Managed Care, Other (non HMO) | Source: Ambulatory Visit | Attending: Internal Medicine | Admitting: Internal Medicine

## 2021-04-05 DIAGNOSIS — Z1231 Encounter for screening mammogram for malignant neoplasm of breast: Secondary | ICD-10-CM | POA: Insufficient documentation

## 2021-10-04 ENCOUNTER — Other Ambulatory Visit: Payer: Managed Care, Other (non HMO)

## 2021-10-05 ENCOUNTER — Ambulatory Visit: Payer: Managed Care, Other (non HMO) | Admitting: Oncology

## 2021-10-21 ENCOUNTER — Other Ambulatory Visit: Payer: Self-pay

## 2021-10-22 ENCOUNTER — Inpatient Hospital Stay: Payer: Managed Care, Other (non HMO) | Attending: Oncology

## 2021-10-22 DIAGNOSIS — Z90710 Acquired absence of both cervix and uterus: Secondary | ICD-10-CM | POA: Insufficient documentation

## 2021-10-22 DIAGNOSIS — D751 Secondary polycythemia: Secondary | ICD-10-CM | POA: Diagnosis not present

## 2021-10-22 DIAGNOSIS — Z8041 Family history of malignant neoplasm of ovary: Secondary | ICD-10-CM | POA: Diagnosis not present

## 2021-10-22 DIAGNOSIS — I10 Essential (primary) hypertension: Secondary | ICD-10-CM | POA: Diagnosis not present

## 2021-10-22 DIAGNOSIS — Z807 Family history of other malignant neoplasms of lymphoid, hematopoietic and related tissues: Secondary | ICD-10-CM | POA: Insufficient documentation

## 2021-10-22 DIAGNOSIS — Z803 Family history of malignant neoplasm of breast: Secondary | ICD-10-CM | POA: Insufficient documentation

## 2021-10-22 LAB — CBC WITH DIFFERENTIAL/PLATELET
Abs Immature Granulocytes: 0.01 10*3/uL (ref 0.00–0.07)
Basophils Absolute: 0.1 10*3/uL (ref 0.0–0.1)
Basophils Relative: 1 %
Eosinophils Absolute: 0.1 10*3/uL (ref 0.0–0.5)
Eosinophils Relative: 1 %
HCT: 41.6 % (ref 36.0–46.0)
Hemoglobin: 15.2 g/dL — ABNORMAL HIGH (ref 12.0–15.0)
Immature Granulocytes: 0 %
Lymphocytes Relative: 29 %
Lymphs Abs: 2 10*3/uL (ref 0.7–4.0)
MCH: 32.6 pg (ref 26.0–34.0)
MCHC: 36.5 g/dL — ABNORMAL HIGH (ref 30.0–36.0)
MCV: 89.3 fL (ref 80.0–100.0)
Monocytes Absolute: 0.5 10*3/uL (ref 0.1–1.0)
Monocytes Relative: 7 %
Neutro Abs: 4.2 10*3/uL (ref 1.7–7.7)
Neutrophils Relative %: 62 %
Platelets: 179 10*3/uL (ref 150–400)
RBC: 4.66 MIL/uL (ref 3.87–5.11)
RDW: 12.3 % (ref 11.5–15.5)
WBC: 6.8 10*3/uL (ref 4.0–10.5)
nRBC: 0 % (ref 0.0–0.2)

## 2021-10-22 LAB — IRON AND TIBC
Iron: 149 ug/dL (ref 28–170)
Saturation Ratios: 54 % — ABNORMAL HIGH (ref 10.4–31.8)
TIBC: 274 ug/dL (ref 250–450)
UIBC: 125 ug/dL

## 2021-10-22 LAB — HEPATIC FUNCTION PANEL
ALT: 17 U/L (ref 0–44)
AST: 20 U/L (ref 15–41)
Albumin: 4.1 g/dL (ref 3.5–5.0)
Alkaline Phosphatase: 65 U/L (ref 38–126)
Bilirubin, Direct: 0.2 mg/dL (ref 0.0–0.2)
Indirect Bilirubin: 0.6 mg/dL (ref 0.3–0.9)
Total Bilirubin: 0.8 mg/dL (ref 0.3–1.2)
Total Protein: 7.4 g/dL (ref 6.5–8.1)

## 2021-10-22 LAB — FERRITIN: Ferritin: 171 ng/mL (ref 11–307)

## 2021-10-26 ENCOUNTER — Encounter: Payer: Self-pay | Admitting: Oncology

## 2021-10-26 ENCOUNTER — Inpatient Hospital Stay (HOSPITAL_BASED_OUTPATIENT_CLINIC_OR_DEPARTMENT_OTHER): Payer: Managed Care, Other (non HMO) | Admitting: Oncology

## 2021-10-26 DIAGNOSIS — D751 Secondary polycythemia: Secondary | ICD-10-CM

## 2021-10-26 NOTE — Progress Notes (Signed)
Hematology/Oncology Progress note [phone] [phone]      Patient Care Team: Lynnea Ferrier, MD as PCP - General (Internal Medicine)  REFERRING PROVIDER: Lynnea Ferrier, MD  CHIEF COMPLAINTS/REASON FOR VISIT:   hereditary hemochromatosis, erythrocytosis  HISTORY OF PRESENTING ILLNESS:  Holly Mcclain is a 56 y.o. female who was seen in consultation at the request of Lynnea Ferrier, MD for evaluation of hemochromatosis Patient recent blood work showed elevated hemoglobin. 12/17/2019 hemochromatosis panel was ordered for work-up Results was positive for 2 copies of the C282Y mutation Ferritin 132, iron 144 patient was referred to establish care with hematology for further evaluation. She denies any alcohol use, skin rash, abdominal pain, shortness of breath, chest pain, leg swelling  Patient reports history of mild version of von Willebrand disease and was previously seen by Dr. Nelida Gores and was discharged.  Status post hysterectomy in 2009 Family history is positive for breast cancer and ovarian cancer in maternal grandmother.  No other family history of cancer.   INTERVAL HISTORY Holly Mcclain is a 56 y.o. female who has above history reviewed by me today presents for follow up visit for  hereditary hemochromatosis, erythrocytosis Per patient, she has had sleep study done which showed sleep apnea.  She was recommended to utilize CPAP.  Patient felt that the home sleep study is not accurate.  She has not started using CPAP yet. Patient avoids any iron supplementation.  She would also  Review of Systems  Constitutional:  Negative for appetite change, chills, fatigue and fever.  HENT:   Negative for hearing loss and voice change.   Eyes:  Negative for eye problems.  Respiratory:  Negative for chest tightness and cough.   Cardiovascular:  Negative for chest pain.  Gastrointestinal:  Negative for abdominal distention, abdominal pain and blood in  stool.  Endocrine: Negative for hot flashes.  Genitourinary:  Negative for difficulty urinating and frequency.   Musculoskeletal:  Negative for arthralgias.  Skin:  Negative for itching and rash.  Neurological:  Negative for extremity weakness.  Hematological:  Negative for adenopathy.  Psychiatric/Behavioral:  Negative for confusion.     MEDICAL HISTORY:  Past Medical History:  Diagnosis Date   Burning mouth syndrome    Hypertension    Von Willebrand factor inhibitor disorder (HCC)     SURGICAL HISTORY: Past Surgical History:  Procedure Laterality Date   ABDOMINAL HYSTERECTOMY  2009    SOCIAL HISTORY: Social History   Socioeconomic History   Marital status: Married    Spouse name: Not on file   Number of children: Not on file   Years of education: Not on file   Highest education level: Not on file  Occupational History   Not on file  Tobacco Use   Smoking status: Never   Smokeless tobacco: Never  Vaping Use   Vaping Use: Never used  Substance and Sexual Activity   Alcohol use: Never   Drug use: Never   Sexual activity: Yes  Other Topics Concern   Not on file  Social History Narrative   Not on file   Social Determinants of Health   Financial Resource Strain: Not on file  Food Insecurity: Not on file  Transportation Needs: Not on file  Physical Activity: Not on file  Stress: Not on file  Social Connections: Not on file  Intimate Partner Violence: Not on file    FAMILY HISTORY: Family History  Problem Relation Age of Onset  Breast cancer Maternal Grandmother        84   Ovarian cancer Maternal Grandmother    Lymphoma Maternal Grandfather     ALLERGIES:  is allergic to aspirin.  MEDICATIONS:  Current Outpatient Medications  Medication Sig Dispense Refill   triamterene-hydrochlorothiazide (MAXZIDE-25) 37.5-25 MG tablet Take 1 tablet by mouth daily.     acetaminophen (TYLENOL) 500 MG tablet Take by mouth every 6 (six) hours as needed.       fluticasone (FLONASE) 50 MCG/ACT nasal spray Place into the nose daily as needed.  (Patient not taking: Reported on 10/05/2020)     loratadine (CLARITIN) 10 MG tablet Take by mouth daily as needed.      No current facility-administered medications for this visit.     PHYSICAL EXAMINATION: ECOG PERFORMANCE STATUS: 0 - Asymptomatic Vitals:   10/26/21 1427  BP: 128/70  Pulse: 68  Temp: (!) 97.3 F (36.3 C)   Filed Weights   10/26/21 1427  Weight: 177 lb 6.4 oz (80.5 kg)    Physical Exam Constitutional:      General: She is not in acute distress. HENT:     Head: Normocephalic and atraumatic.  Eyes:     General: No scleral icterus. Cardiovascular:     Rate and Rhythm: Normal rate and regular rhythm.     Heart sounds: Normal heart sounds.  Pulmonary:     Effort: Pulmonary effort is normal. No respiratory distress.     Breath sounds: No wheezing.  Abdominal:     General: Bowel sounds are normal. There is no distension.     Palpations: Abdomen is soft.  Musculoskeletal:        General: No deformity. Normal range of motion.     Cervical back: Normal range of motion and neck supple.  Skin:    General: Skin is warm and dry.     Findings: No erythema or rash.  Neurological:     Mental Status: She is alert and oriented to person, place, and time. Mental status is at baseline.     Cranial Nerves: No cranial nerve deficit.     Coordination: Coordination normal.  Psychiatric:        Mood and Affect: Mood normal.      LABORATORY DATA:  I have reviewed the data as listed    Latest Ref Rng & Units 10/22/2021    8:19 AM 10/02/2020    2:29 PM 04/03/2020   11:09 AM  CBC  WBC 4.0 - 10.5 K/uL 6.8  8.2  6.4   Hemoglobin 12.0 - 15.0 g/dL 76.7  34.1  93.7   Hematocrit 36.0 - 46.0 % 41.6  40.7  38.9   Platelets 150 - 400 K/uL 179  175  164       Latest Ref Rng & Units 10/22/2021    8:19 AM 10/02/2020    2:29 PM 01/01/2020   11:42 AM  CMP  Total Protein 6.5 - 8.1 g/dL 7.4  7.2  8.0    Total Bilirubin 0.3 - 1.2 mg/dL 0.8  0.5  0.6   Alkaline Phos 38 - 126 U/L 65  63  65   AST 15 - 41 U/L 20  17  21    ALT 0 - 44 U/L 17  17  26      Iron/TIBC/Ferritin/ %Sat    Component Value Date/Time   IRON 149 10/22/2021 0819   TIBC 274 10/22/2021 0819   FERRITIN 171 10/22/2021 0819   IRONPCTSAT 54 (H) 10/22/2021  1751      RADIOGRAPHIC STUDIES: I have personally reviewed the radiological images as listed and agreed with the findings in the report.  No results found.   ASSESSMENT & PLAN:  1. Hereditary hemochromatosis (HCC)   2. Erythrocytosis    hereditary hemochromatosis, homozygous  C282Y mutation Labs are reviewed and discussed with patient. Ferritin is less than 500.  No transaminitis or elevated bilirubin.  Previous ultrasound right upper quadrant showed normal findings.   Patient has persistently elevated iron saturation Recommend empiric phlebotomy x 1.   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 JAK2 V617F mutation negative, with reflex to other mutations CALR, MPL, JAK 2 Ex 12-15 mutations negative. I  Follow up 1 year for follow-up.  Orders Placed This Encounter  Procedures   CBC with Differential/Platelet    Standing Status:   Future    Standing Expiration Date:   10/27/2022   Iron and TIBC    Standing Status:   Future    Standing Expiration Date:   10/27/2022   Ferritin    Standing Status:   Future    Standing Expiration Date:   10/27/2022   Hepatic function panel    Standing Status:   Future    Standing Expiration Date:   10/26/2022    All questions were answered. The patient knows to call the clinic with any problems questions or concerns.  Cc Lynnea Ferrier, MD   Thank you for this kind referral and the opportunity to participate in the care of this patient. A copy of today's note is routed to referring provider     Rickard Patience, MD, PhD Hematology Oncology [facility] Pager-  [phone] 10/26/2021

## 2021-11-10 ENCOUNTER — Inpatient Hospital Stay: Payer: Managed Care, Other (non HMO) | Attending: Oncology

## 2021-11-10 MED ORDER — SODIUM CHLORIDE 0.9 % IV SOLN
Freq: Once | INTRAVENOUS | Status: DC
  Filled 2021-11-10: qty 250

## 2021-11-10 NOTE — Progress Notes (Signed)
Therapeutic phlebotomy initiated in Left AC at 1500. Aprox of blood had been removed when patient voiced that she felt hot and nauseated. Pt also c/o feeling light headed. Skin cool and diaphoretic. Phlebotomy stopped. BP 73/43. NS initiated at 921ml/hr. Cold compress and oral hydration provided. Dr. Cathie Hoops notified. She would like for pt to receive  fluids and then re-attempt to finish phlebotomy procedure. 1530- BP 119/75, fluids stopped and phlebotomy restarted. Pt remained asymptomatic for duration of procedure and completed at 1547. Remaining NS infused. A total of NS was given. Vital signs stable at discharge.

## 2021-11-10 NOTE — Patient Instructions (Signed)

## 2022-04-11 ENCOUNTER — Other Ambulatory Visit: Payer: Self-pay | Admitting: Internal Medicine

## 2022-04-11 DIAGNOSIS — Z1231 Encounter for screening mammogram for malignant neoplasm of breast: Secondary | ICD-10-CM

## 2022-04-15 ENCOUNTER — Ambulatory Visit
Admission: RE | Admit: 2022-04-15 | Discharge: 2022-04-15 | Disposition: A | Discharge status: Home / Self Care | Payer: Managed Care, Other (non HMO) | Source: Ambulatory Visit | Attending: Internal Medicine | Admitting: Internal Medicine

## 2022-04-15 DIAGNOSIS — Z1231 Encounter for screening mammogram for malignant neoplasm of breast: Secondary | ICD-10-CM | POA: Diagnosis present

## 2022-10-25 ENCOUNTER — Inpatient Hospital Stay: Payer: Managed Care, Other (non HMO) | Attending: Oncology

## 2022-10-25 DIAGNOSIS — D751 Secondary polycythemia: Secondary | ICD-10-CM | POA: Diagnosis not present

## 2022-10-25 DIAGNOSIS — Z803 Family history of malignant neoplasm of breast: Secondary | ICD-10-CM | POA: Insufficient documentation

## 2022-10-25 DIAGNOSIS — G4730 Sleep apnea, unspecified: Secondary | ICD-10-CM | POA: Insufficient documentation

## 2022-10-25 DIAGNOSIS — Z807 Family history of other malignant neoplasms of lymphoid, hematopoietic and related tissues: Secondary | ICD-10-CM | POA: Insufficient documentation

## 2022-10-25 DIAGNOSIS — Z90710 Acquired absence of both cervix and uterus: Secondary | ICD-10-CM | POA: Diagnosis not present

## 2022-10-25 DIAGNOSIS — Z8041 Family history of malignant neoplasm of ovary: Secondary | ICD-10-CM | POA: Diagnosis not present

## 2022-10-25 LAB — HEPATIC FUNCTION PANEL
ALT: 15 U/L (ref 0–44)
AST: 17 U/L (ref 15–41)
Albumin: 4.3 g/dL (ref 3.5–5.0)
Alkaline Phosphatase: 58 U/L (ref 38–126)
Bilirubin, Direct: 0.2 mg/dL (ref 0.0–0.2)
Indirect Bilirubin: 0.6 mg/dL (ref 0.3–0.9)
Total Bilirubin: 0.8 mg/dL (ref 0.3–1.2)
Total Protein: 7.4 g/dL (ref 6.5–8.1)

## 2022-10-25 LAB — CBC WITH DIFFERENTIAL/PLATELET
Abs Immature Granulocytes: 0.01 10*3/uL (ref 0.00–0.07)
Basophils Absolute: 0 10*3/uL (ref 0.0–0.1)
Basophils Relative: 1 %
Eosinophils Absolute: 0 10*3/uL (ref 0.0–0.5)
Eosinophils Relative: 1 %
HCT: 42.8 % (ref 36.0–46.0)
Hemoglobin: 15.1 g/dL — ABNORMAL HIGH (ref 12.0–15.0)
Immature Granulocytes: 0 %
Lymphocytes Relative: 29 %
Lymphs Abs: 1.6 10*3/uL (ref 0.7–4.0)
MCH: 31.6 pg (ref 26.0–34.0)
MCHC: 35.3 g/dL (ref 30.0–36.0)
MCV: 89.5 fL (ref 80.0–100.0)
Monocytes Absolute: 0.5 10*3/uL (ref 0.1–1.0)
Monocytes Relative: 9 %
Neutro Abs: 3.5 10*3/uL (ref 1.7–7.7)
Neutrophils Relative %: 60 %
Platelets: 156 10*3/uL (ref 150–400)
RBC: 4.78 MIL/uL (ref 3.87–5.11)
RDW: 12 % (ref 11.5–15.5)
WBC: 5.7 10*3/uL (ref 4.0–10.5)
nRBC: 0 % (ref 0.0–0.2)

## 2022-10-25 LAB — FERRITIN: Ferritin: 128 ng/mL (ref 11–307)

## 2022-10-25 LAB — IRON AND TIBC
Iron: 123 ug/dL (ref 28–170)
Saturation Ratios: 46 % — ABNORMAL HIGH (ref 10.4–31.8)
TIBC: 267 ug/dL (ref 250–450)
UIBC: 144 ug/dL

## 2022-10-27 ENCOUNTER — Encounter: Payer: Self-pay | Admitting: Oncology

## 2022-10-27 ENCOUNTER — Inpatient Hospital Stay: Payer: Managed Care, Other (non HMO)

## 2022-10-27 ENCOUNTER — Inpatient Hospital Stay: Payer: Managed Care, Other (non HMO) | Admitting: Oncology

## 2022-10-27 DIAGNOSIS — D751 Secondary polycythemia: Secondary | ICD-10-CM | POA: Diagnosis not present

## 2022-10-27 MED ORDER — SODIUM CHLORIDE 0.9 % IV SOLN
Freq: Once | INTRAVENOUS | Status: AC
  Filled 2022-10-27: qty 250

## 2022-10-27 NOTE — Progress Notes (Signed)
Hematology/Oncology Progress note [phone] [phone]      Patient Care Team: Lynnea Ferrier, MD as PCP - General (Internal Medicine) Rickard Patience, MD as Consulting Physician (Oncology)  CHIEF COMPLAINTS/REASON FOR VISIT:  Hereditary hemochromatosis, erythrocytosis  ASSESSMENT & PLAN:   Hemochromatosis, hereditary (HCC) Homozygous  C282Y mutation  Lab Results  Component Value Date   HGB 15.1 (H) 10/25/2022   TIBC 267 10/25/2022   IRONPCTSAT 46 (H) 10/25/2022   FERRITIN 128 10/25/2022    Recommend phlebotomy x 1.  LFT is stable. Previous ultrasound right upper quadrant showed normal findings. Avoid iron supplementation, alcohol use.  Recommend first-degree relatives to be screened.   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 JAK2 V617F mutation negative, with reflex to other mutations CALR, MPL, JAK 2 Ex 12-15 mutations negative. Hemoglobin slightly elevated.   Continue observation.  Orders Placed This Encounter  Procedures   Hepatic function panel    Standing Status:   Future    Standing Expiration Date:   10/27/2023   CBC with Differential (Cancer Center Only)    Standing Status:   Future    Standing Expiration Date:   10/27/2023   Iron and TIBC    Standing Status:   Future    Standing Expiration Date:   10/27/2023   Ferritin    Standing Status:   Future    Standing Expiration Date:   10/27/2023   Follow-up in 1 year. All questions were answered. The patient knows to call the clinic with any problems, questions or concerns.  Rickard Patience, MD, PhD [name] 10/27/2022   HISTORY OF PRESENTING ILLNESS:  Holly Mcclain is a 57 y.o. female who was seen in consultation at the request of Lynnea Ferrier, MD for evaluation of hemochromatosis Patient recent blood work showed elevated hemoglobin. 12/17/2019 hemochromatosis panel was ordered for work-up Results was positive for 2 copies of the  C282Y mutation Ferritin 132, iron 144 patient was referred to establish care with hematology for further evaluation. She denies any alcohol use, skin rash, abdominal pain, shortness of breath, chest pain, leg swelling  Patient reports history of mild version of von Willebrand disease and was previously seen by Dr. Nelida Gores and was discharged.  Status post hysterectomy in 2009 Family history is positive for breast cancer and ovarian cancer in maternal grandmother.  No other family history of cancer.   INTERVAL HISTORY Holly Mcclain is a 57 y.o. female who has above history reviewed by me today presents for follow up visit for  hereditary hemochromatosis, erythrocytosis Patient has sleep apnea.  She does not utilize CPAP Patient reports feeling well.  Denies any new complaints.  Review of Systems  Constitutional:  Negative for appetite change, chills, fatigue and fever.  HENT:   Negative for hearing loss and voice change.   Eyes:  Negative for eye problems.  Respiratory:  Negative for chest tightness and cough.   Cardiovascular:  Negative for chest pain.  Gastrointestinal:  Negative for abdominal distention, abdominal pain and blood in stool.  Endocrine: Negative for hot flashes.  Genitourinary:  Negative for difficulty urinating and frequency.   Musculoskeletal:  Negative for arthralgias.  Skin:  Negative for itching and rash.  Neurological:  Negative for extremity weakness.  Hematological:  Negative for adenopathy.  Psychiatric/Behavioral:  Negative for confusion.     MEDICAL HISTORY:  Past Medical History:  Diagnosis Date   Burning mouth syndrome    Hypertension  Von Willebrand factor inhibitor disorder (HCC)     SURGICAL HISTORY: Past Surgical History:  Procedure Laterality Date   ABDOMINAL HYSTERECTOMY  2009    SOCIAL HISTORY: Social History   Socioeconomic History   Marital status: Married    Spouse name: Not on file   Number of children: Not on file   Years of  education: Not on file   Highest education level: Not on file  Occupational History   Not on file  Tobacco Use   Smoking status: Never   Smokeless tobacco: Never  Vaping Use   Vaping status: Never Used  Substance and Sexual Activity   Alcohol use: Never   Drug use: Never   Sexual activity: Yes  Other Topics Concern   Not on file  Social History Narrative   Not on file   Social Determinants of Health   Financial Resource Strain: Not on file  Food Insecurity: Not on file  Transportation Needs: Not on file  Physical Activity: Not on file  Stress: Not on file  Social Connections: Not on file  Intimate Partner Violence: Not on file    FAMILY HISTORY: Family History  Problem Relation Age of Onset   Breast cancer Maternal Grandmother        76   Ovarian cancer Maternal Grandmother    Lymphoma Maternal Grandfather     ALLERGIES:  is allergic to aspirin.  MEDICATIONS:  Current Outpatient Medications  Medication Sig Dispense Refill   triamterene-hydrochlorothiazide (MAXZIDE-25) 37.5-25 MG tablet Take 1 tablet by mouth daily.     No current facility-administered medications for this visit.     PHYSICAL EXAMINATION: ECOG PERFORMANCE STATUS: 0 - Asymptomatic Vitals:   10/27/22 1335  BP: (!) 149/77  Pulse: 80  Resp: 18  Temp: (!) 97.5 F (36.4 C)  SpO2: 100%   Filed Weights   10/27/22 1335  Weight: 179 lb 4.8 oz (81.3 kg)    Physical Exam Constitutional:      General: She is not in acute distress. HENT:     Head: Normocephalic and atraumatic.  Eyes:     General: No scleral icterus. Cardiovascular:     Rate and Rhythm: Normal rate and regular rhythm.     Heart sounds: Normal heart sounds.  Pulmonary:     Effort: Pulmonary effort is normal. No respiratory distress.     Breath sounds: No wheezing.  Abdominal:     General: Bowel sounds are normal. There is no distension.     Palpations: Abdomen is soft.  Musculoskeletal:        General: No deformity.  Normal range of motion.     Cervical back: Normal range of motion and neck supple.  Skin:    General: Skin is warm and dry.     Findings: No erythema or rash.  Neurological:     Mental Status: She is alert and oriented to person, place, and time. Mental status is at baseline.     Cranial Nerves: No cranial nerve deficit.     Coordination: Coordination normal.  Psychiatric:        Mood and Affect: Mood normal.      LABORATORY DATA:  I have reviewed the data as listed    Latest Ref Rng & Units 10/25/2022    8:01 AM 10/22/2021    8:19 AM 10/02/2020    2:29 PM  CBC  WBC 4.0 - 10.5 K/uL 5.7  6.8  8.2   Hemoglobin 12.0 - 15.0 g/dL 15.1  15.2  14.5   Hematocrit 36.0 - 46.0 % 42.8  41.6  40.7   Platelets 150 - 400 K/uL 156  179  175       Latest Ref Rng & Units 10/25/2022    8:01 AM 10/22/2021    8:19 AM 10/02/2020    2:29 PM  CMP  Total Protein 6.5 - 8.1 g/dL 7.4  7.4  7.2   Total Bilirubin 0.3 - 1.2 mg/dL 0.8  0.8  0.5   Alkaline Phos 38 - 126 U/L 58  65  63   AST 15 - 41 U/L 17  20  17    ALT 0 - 44 U/L 15  17  17      Iron/TIBC/Ferritin/ %Sat    Component Value Date/Time   IRON 123 10/25/2022 0801   TIBC 267 10/25/2022 0801   FERRITIN 128 10/25/2022 0801   IRONPCTSAT 46 (H) 10/25/2022 0801      RADIOGRAPHIC STUDIES: I have personally reviewed the radiological images as listed and agreed with the findings in the report.  No results found.

## 2022-10-27 NOTE — Progress Notes (Signed)
Patient presents to fluid clinic for therapeutic phlebotomy. 1433 After IV was started and phlebotomy initiated, pt felt dizzy and lightheaded. Bp checked 87/56; HR 61.  Patient given a glass of ice water. 1436- Bp improved 95/63 HR 58.   1445-pt bp rechecked - 111/71; HR61

## 2022-10-27 NOTE — Assessment & Plan Note (Addendum)
Homozygous  C282Y mutation  Lab Results  Component Value Date   HGB 15.1 (H) 10/25/2022   TIBC 267 10/25/2022   IRONPCTSAT 46 (H) 10/25/2022   FERRITIN 128 10/25/2022    Recommend phlebotomy x 1.  LFT is stable. Previous ultrasound right upper quadrant showed normal findings. Avoid iron supplementation, alcohol use.  Recommend first-degree relatives to be screened.

## 2022-10-27 NOTE — Assessment & Plan Note (Signed)
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 JAK2 V617F mutation negative, with reflex to other mutations CALR, MPL, JAK 2 Ex 12-15 mutations negative. Hemoglobin slightly elevated.   Continue observation.

## 2023-04-07 ENCOUNTER — Other Ambulatory Visit: Payer: Self-pay | Admitting: Internal Medicine

## 2023-04-07 DIAGNOSIS — Z1231 Encounter for screening mammogram for malignant neoplasm of breast: Secondary | ICD-10-CM

## 2023-04-18 ENCOUNTER — Ambulatory Visit
Admission: RE | Admit: 2023-04-18 | Discharge: 2023-04-18 | Disposition: A | Discharge status: Home / Self Care | Payer: Managed Care, Other (non HMO) | Source: Ambulatory Visit | Attending: Internal Medicine | Admitting: Internal Medicine

## 2023-04-18 DIAGNOSIS — Z1231 Encounter for screening mammogram for malignant neoplasm of breast: Secondary | ICD-10-CM | POA: Insufficient documentation

## 2023-08-10 IMAGING — MG MM DIGITAL SCREENING BILAT W/ TOMO AND CAD
8 series · 8 of 24 positions shown · non-contrast
Comparison: Previous exam(s).

CLINICAL DATA: Screening.

EXAM:
DIGITAL SCREENING BILATERAL MAMMOGRAM WITH TOMOSYNTHESIS AND CAD
TECHNIQUE: Bilateral screening digital craniocaudal and mediolateral oblique
mammograms were obtained. Bilateral screening digital breast
tomosynthesis was performed. The images were evaluated with
computer-aided detection.

[R CC synth-2D]
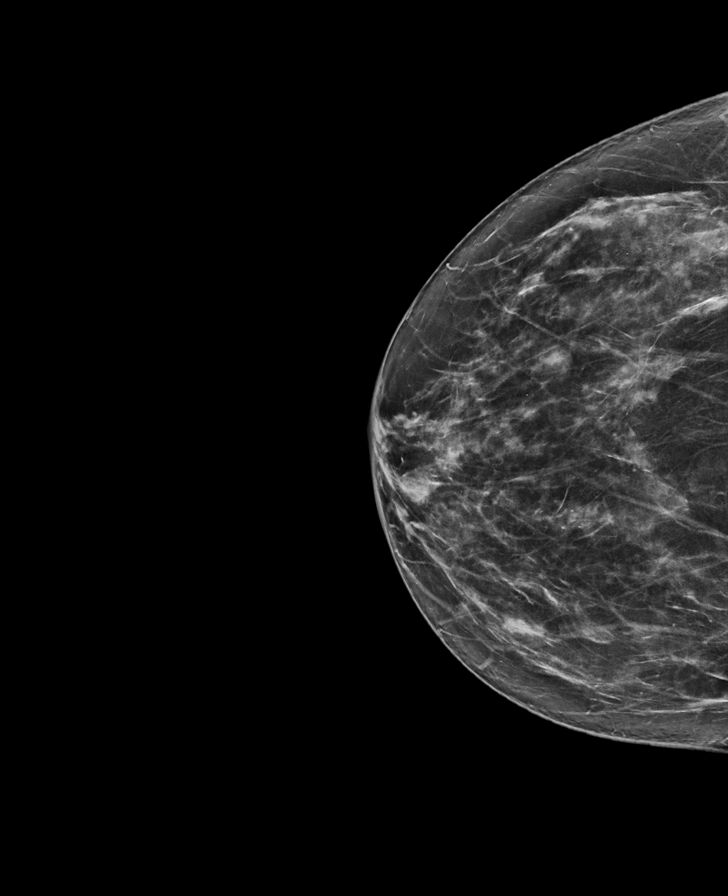

[L CC synth-2D]
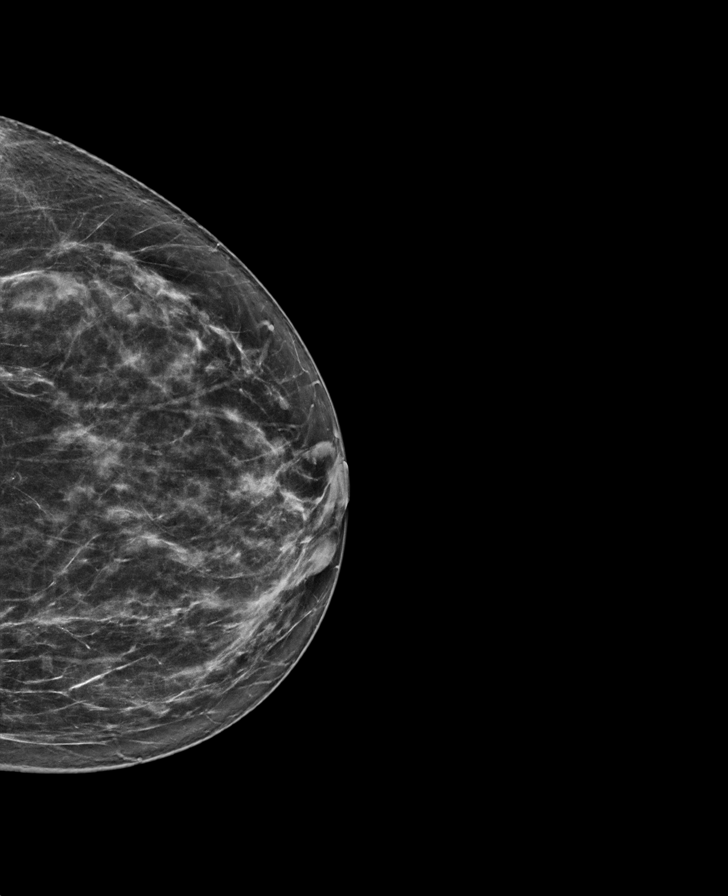

[R MLO synth-2D]
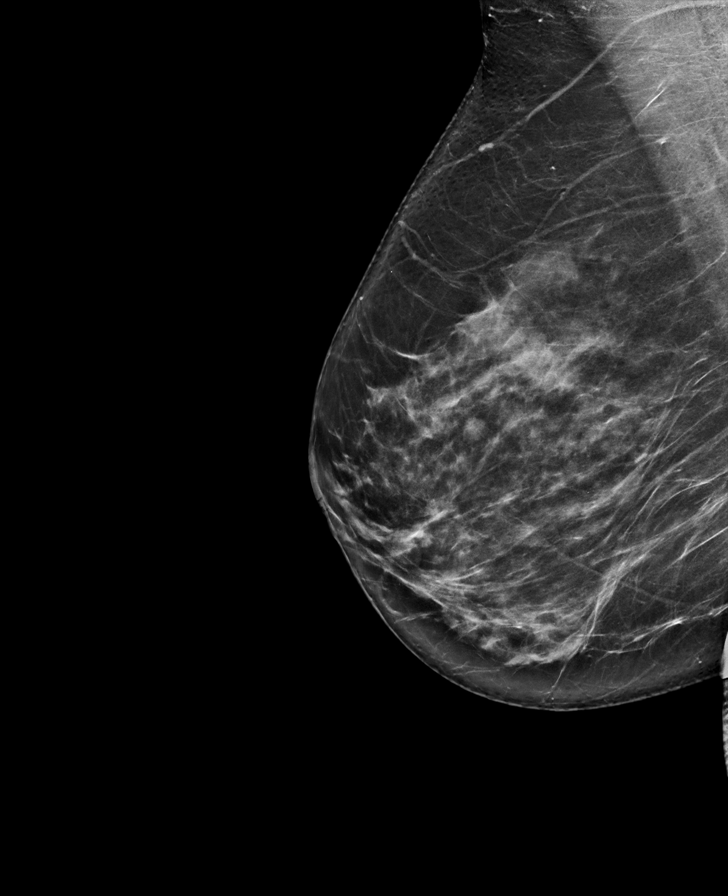

[L MLO synth-2D]
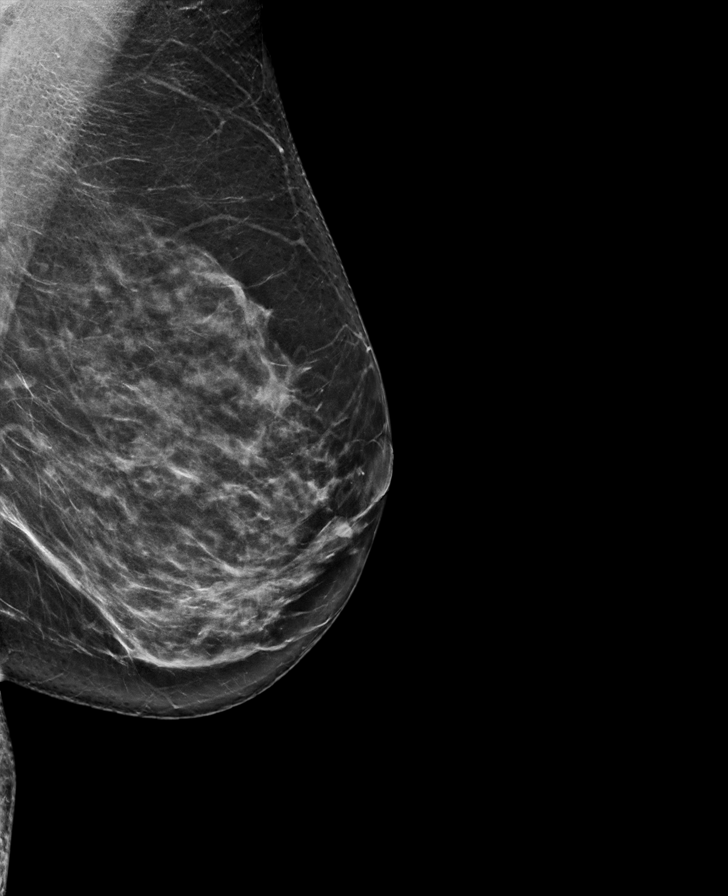

[L CC tomo · tomo slice 40/79.0]
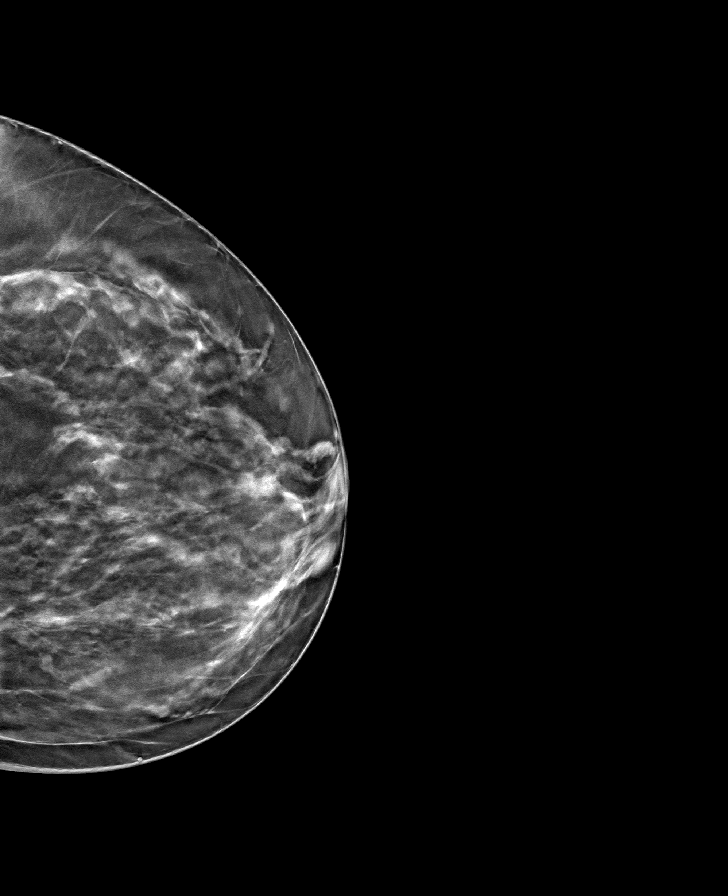

[R CC tomo · tomo slice 41/80.0]
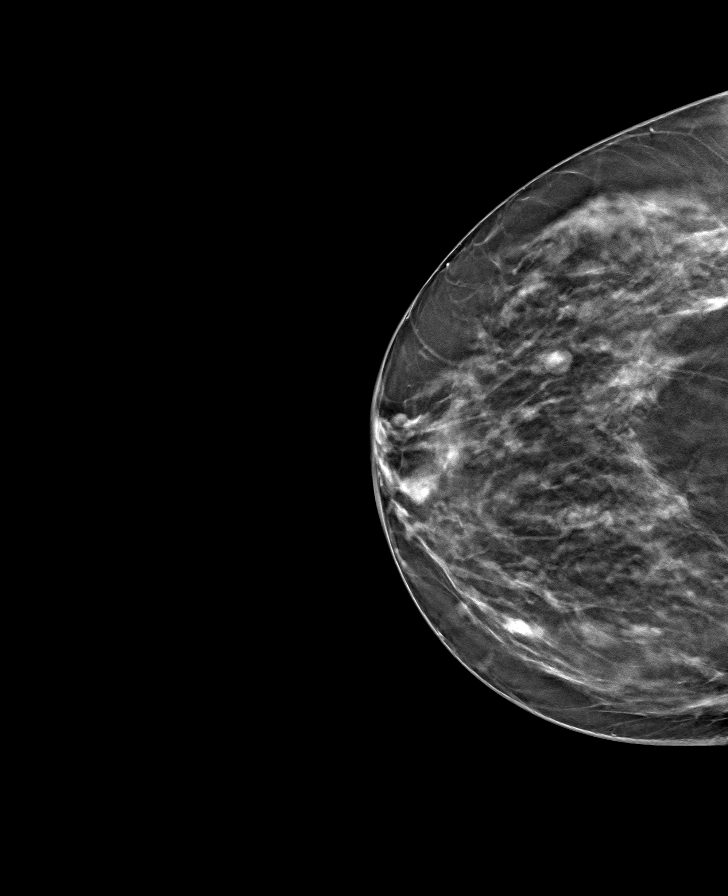

[R MLO tomo · tomo slice 43/86.0]
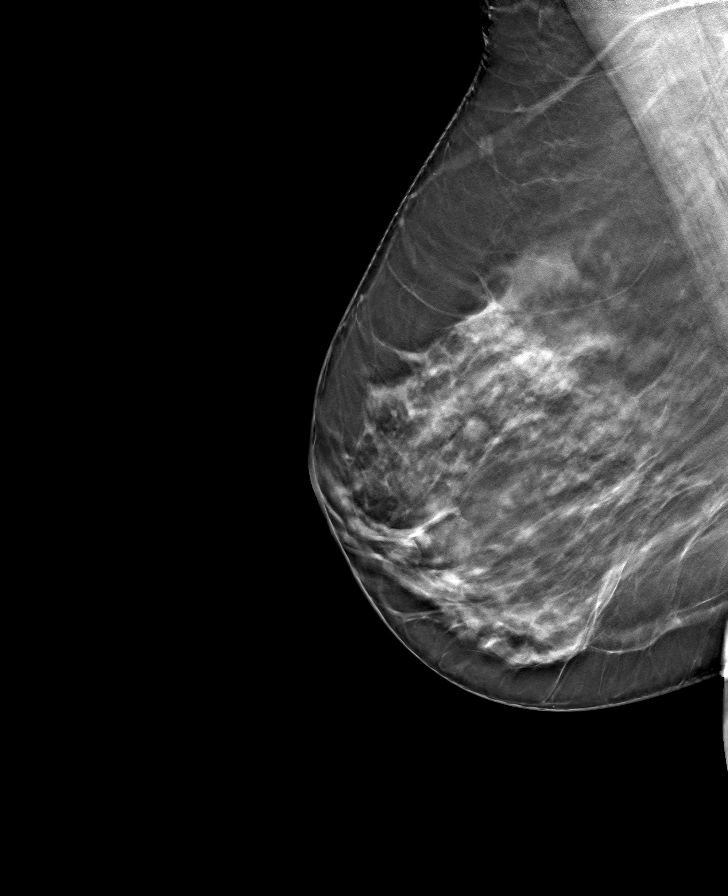

[L MLO tomo · tomo slice 43/85.0]
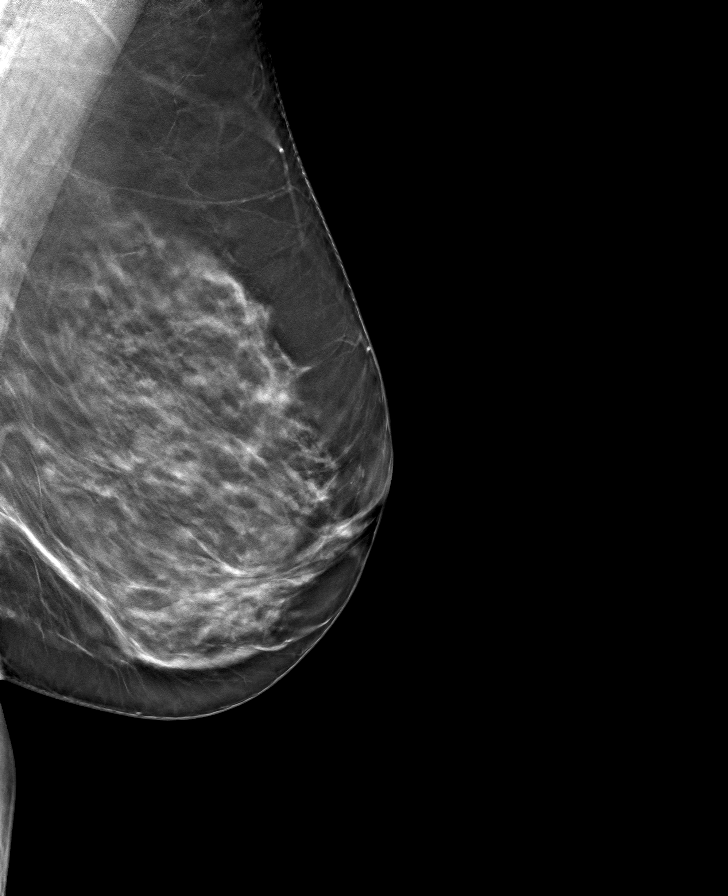

[8 of 24 positions shown; findings below may reference images not displayed]

ACR Breast Density Category c: The breast tissue is heterogeneously
dense, which may obscure small masses.
FINDINGS: There are no findings suspicious for malignancy.
IMPRESSION: No mammographic evidence of malignancy. A result letter of this
screening mammogram will be mailed directly to the patient.

RECOMMENDATION:
Screening mammogram in one year. (Code:Q3-W-BC3)

BI-RADS CATEGORY  1: Negative.

## 2023-10-23 ENCOUNTER — Inpatient Hospital Stay: Payer: Managed Care, Other (non HMO) | Attending: Oncology

## 2023-10-23 DIAGNOSIS — Z807 Family history of other malignant neoplasms of lymphoid, hematopoietic and related tissues: Secondary | ICD-10-CM | POA: Diagnosis not present

## 2023-10-23 DIAGNOSIS — Z8041 Family history of malignant neoplasm of ovary: Secondary | ICD-10-CM | POA: Insufficient documentation

## 2023-10-23 DIAGNOSIS — Z803 Family history of malignant neoplasm of breast: Secondary | ICD-10-CM | POA: Insufficient documentation

## 2023-10-23 DIAGNOSIS — D751 Secondary polycythemia: Secondary | ICD-10-CM | POA: Diagnosis not present

## 2023-10-23 DIAGNOSIS — G4730 Sleep apnea, unspecified: Secondary | ICD-10-CM | POA: Insufficient documentation

## 2023-10-23 LAB — HEPATIC FUNCTION PANEL
ALT: 17 U/L (ref 0–44)
AST: 21 U/L (ref 15–41)
Albumin: 4.3 g/dL (ref 3.5–5.0)
Alkaline Phosphatase: 69 U/L (ref 38–126)
Bilirubin, Direct: <0.1 mg/dL (ref 0.0–0.2)
Total Bilirubin: 1.1 mg/dL (ref 0.0–1.2)
Total Protein: 7.4 g/dL (ref 6.5–8.1)

## 2023-10-23 LAB — IRON AND TIBC
Iron: 180 ug/dL — ABNORMAL HIGH (ref 28–170)
Saturation Ratios: 61 % — ABNORMAL HIGH (ref 10.4–31.8)
TIBC: 297 ug/dL (ref 250–450)
UIBC: 117 ug/dL

## 2023-10-23 LAB — CBC WITH DIFFERENTIAL (CANCER CENTER ONLY)
Abs Immature Granulocytes: 0.03 K/uL (ref 0.00–0.07)
Basophils Absolute: 0 K/uL (ref 0.0–0.1)
Basophils Relative: 1 %
Eosinophils Absolute: 0.1 K/uL (ref 0.0–0.5)
Eosinophils Relative: 1 %
HCT: 43.7 % (ref 36.0–46.0)
Hemoglobin: 15.3 g/dL — ABNORMAL HIGH (ref 12.0–15.0)
Immature Granulocytes: 1 %
Lymphocytes Relative: 26 %
Lymphs Abs: 1.6 K/uL (ref 0.7–4.0)
MCH: 31.4 pg (ref 26.0–34.0)
MCHC: 35 g/dL (ref 30.0–36.0)
MCV: 89.7 fL (ref 80.0–100.0)
Monocytes Absolute: 0.6 K/uL (ref 0.1–1.0)
Monocytes Relative: 9 %
Neutro Abs: 3.7 K/uL (ref 1.7–7.7)
Neutrophils Relative %: 62 %
Platelet Count: 168 K/uL (ref 150–400)
RBC: 4.87 MIL/uL (ref 3.87–5.11)
RDW: 11.9 % (ref 11.5–15.5)
WBC Count: 5.9 K/uL (ref 4.0–10.5)
nRBC: 0 % (ref 0.0–0.2)

## 2023-10-23 LAB — FERRITIN: Ferritin: 133 ng/mL (ref 11–307)

## 2023-10-26 ENCOUNTER — Inpatient Hospital Stay (HOSPITAL_BASED_OUTPATIENT_CLINIC_OR_DEPARTMENT_OTHER): Payer: Managed Care, Other (non HMO) | Admitting: Oncology

## 2023-10-26 ENCOUNTER — Inpatient Hospital Stay: Payer: Managed Care, Other (non HMO)

## 2023-10-26 ENCOUNTER — Encounter: Payer: Self-pay | Admitting: Oncology

## 2023-10-26 DIAGNOSIS — D751 Secondary polycythemia: Secondary | ICD-10-CM

## 2023-10-26 MED ORDER — SODIUM CHLORIDE 0.9 % IV SOLN
Freq: Once | INTRAVENOUS | Status: AC
  Filled 2023-10-26: qty 250

## 2023-10-26 NOTE — Assessment & Plan Note (Addendum)
 Homozygous  C282Y mutation.  Previous ultrasound right upper quadrant showed normal findings. Lab Results  Component Value Date   HGB 15.3 (H) 10/23/2023   TIBC 297 10/23/2023   IRONPCTSAT 61 (H) 10/23/2023   FERRITIN 133 10/23/2023   Iron saturation has increased Recommend phlebotomy x 2 LFT is stable.  Avoid iron supplementation, alcohol use.

## 2023-10-26 NOTE — Patient Instructions (Signed)

## 2023-10-26 NOTE — Progress Notes (Signed)
 Holly Mcclain presents today for phlebotomy per MD orders. Phlebotomy procedure started at 1422 and ended at 1430. 300 mls removed. Pt complained of a headache stopped Phlebotomy and started IVF bolus of normal saline. Pt then had a large emesis. Pt stated, I feel much better. Notified Dr. Babara. [name] with holding phlebotomy and complete NS bolus of 1 Liter.  Patient feeling better after bolus, headache gone and vital signs stable.  IV needle removed and intact.

## 2023-10-26 NOTE — Progress Notes (Signed)
 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  Previous ultrasound right upper quadrant showed normal findings. Lab Results  Component Value Date   HGB 15.3 (H) 10/23/2023   TIBC 297 10/23/2023   IRONPCTSAT 61 (H) 10/23/2023   FERRITIN 133 10/23/2023   Iron saturation has increased Recommend phlebotomy x 2 LFT is stable.  Avoid iron supplementation, alcohol use.     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 JAK2 V617F mutation negative, with reflex to other mutations CALR, MPL, JAK 2 Ex 12-15 mutations negative. Hemoglobin slightly elevated.   Continue observation.  Orders Placed This Encounter  Procedures   CBC with Differential (Cancer Center Only)    Standing Status:   Future    Expected Date:   10/25/2024    Expiration Date:   01/23/2025   Iron and TIBC    Standing Status:   Future    Expected Date:   10/25/2024    Expiration Date:   01/23/2025   Ferritin    Standing Status:   Future    Expected Date:   10/25/2024    Expiration Date:   01/23/2025   Hepatic function panel    Standing Status:   Future    Expected Date:   10/25/2024    Expiration Date:   01/23/2025   Follow-up in 1 year. All questions were answered. The patient knows to call the clinic with any problems, questions or concerns.  Call Babara, MD, PhD [facility] Hematology Oncology 10/26/2023   HISTORY OF PRESENTING ILLNESS:  Holly Mcclain is a 58 y.o. female who was seen in consultation at the request of Fernande Ophelia JINNY DOUGLAS, MD for evaluation of hemochromatosis Patient recent blood work showed elevated hemoglobin. 12/17/2019 hemochromatosis  panel was ordered for work-up Results was positive for 2 copies of the C282Y mutation Ferritin 132, iron 144 patient was referred to establish care with hematology for further evaluation. She denies any alcohol use, skin rash, abdominal pain, shortness of breath, chest pain, leg swelling  Patient reports history of mild version of von Willebrand disease and was previously seen by Dr. Laurin and was discharged.  Status post hysterectomy in 2009 Family history is positive for breast cancer and ovarian cancer in maternal grandmother.  No other family history of cancer.   INTERVAL HISTORY Holly Mcclain is a 58 y.o. female who has above history reviewed by me today presents for follow up visit for  hereditary hemochromatosis, erythrocytosis Patient has sleep apnea.  She does not utilize CPAP Patient reports feeling well.  Denies any new complaints.  Review of Systems  Constitutional:  Negative for appetite change, chills, fatigue and fever.  HENT:   Negative for hearing loss and voice change.   Eyes:  Negative for eye problems.  Respiratory:  Negative for chest tightness and cough.   Cardiovascular:  Negative for chest pain.  Gastrointestinal:  Negative for abdominal distention, abdominal pain and blood in stool.  Endocrine: Negative for hot flashes.  Genitourinary:  Negative for difficulty urinating and frequency.   Musculoskeletal:  Negative for arthralgias.  Skin:  Negative for itching and rash.  Neurological:  Negative for extremity weakness.  Hematological:  Negative for adenopathy.  Psychiatric/Behavioral:  Negative  for confusion.     MEDICAL HISTORY:  Past Medical History:  Diagnosis Date   Burning mouth syndrome    Hypertension    Von Willebrand factor inhibitor disorder (HCC)     SURGICAL HISTORY: Past Surgical History:  Procedure Laterality Date   ABDOMINAL HYSTERECTOMY  2009    SOCIAL HISTORY: Social History   Socioeconomic History   Marital status: Married     Spouse name: Not on file   Number of children: Not on file   Years of education: Not on file   Highest education level: Not on file  Occupational History   Not on file  Tobacco Use   Smoking status: Never   Smokeless tobacco: Never  Vaping Use   Vaping status: Never Used  Substance and Sexual Activity   Alcohol use: Never   Drug use: Never   Sexual activity: Yes  Other Topics Concern   Not on file  Social History Narrative   Not on file   Social Drivers of Health   Financial Resource Strain: Low Risk  (02/05/2023)   Received from [facility]   Overall Financial Resource Strain (CARDIA)    Difficulty of Paying Living Expenses: Not hard at all  Food Insecurity: No Food Insecurity (02/05/2023)   Received from [facility]   Hunger Vital Sign    Within the past 12 months, you worried that your food would run out before you got the money to buy more.: Never true    Within the past 12 months, the food you bought just didn't last and you didn't have money to get more.: Never true  Transportation Needs: No Transportation Needs (02/05/2023)   Received from [facility] - Transportation    In the past 12 months, has lack of transportation kept you from medical appointments or from getting medications?: No    Lack of Transportation (Non-Medical): No  Physical Activity: Not on file  Stress: Not on file  Social Connections: Not on file  Intimate Partner Violence: Not on file    FAMILY HISTORY: Family History  Problem Relation Age of Onset   Breast cancer Maternal Grandmother        69   Ovarian cancer Maternal Grandmother    Lymphoma Maternal Grandfather     ALLERGIES:  is allergic to aspirin.  MEDICATIONS:  Current Outpatient Medications  Medication Sig Dispense Refill   triamterene-hydrochlorothiazide (MAXZIDE-25) 37.5-25 MG tablet Take 1 tablet by mouth daily.     No current facility-administered medications  for this visit.     PHYSICAL EXAMINATION: ECOG PERFORMANCE STATUS: 0 - Asymptomatic Vitals:   10/26/23 1355  BP: 126/72  Pulse: 80  Resp: 19  Temp: (!) 97 F (36.1 C)  SpO2: 99%   Filed Weights   10/26/23 1355  Weight: 171 lb 4.8 oz (77.7 kg)    Physical Exam Constitutional:      General: She is not in acute distress. HENT:     Head: Normocephalic and atraumatic.  Eyes:     General: No scleral icterus. Cardiovascular:     Rate and Rhythm: Normal rate and regular rhythm.     Heart sounds: Normal heart sounds.  Pulmonary:     Effort: Pulmonary effort is normal. No respiratory distress.     Breath sounds: No wheezing.  Abdominal:     General: Bowel sounds are normal. There is no distension.     Palpations: Abdomen is soft.  Musculoskeletal:  General: No deformity. Normal range of motion.     Cervical back: Normal range of motion and neck supple.  Skin:    General: Skin is warm and dry.     Findings: No erythema or rash.  Neurological:     Mental Status: She is alert and oriented to person, place, and time. Mental status is at baseline.     Cranial Nerves: No cranial nerve deficit.     Coordination: Coordination normal.  Psychiatric:        Mood and Affect: Mood normal.      LABORATORY DATA:  I have reviewed the data as listed    Latest Ref Rng & Units 10/23/2023    8:08 AM 10/25/2022    8:01 AM 10/22/2021    8:19 AM  CBC  WBC 4.0 - 10.5 K/uL 5.9  5.7  6.8   Hemoglobin 12.0 - 15.0 g/dL 84.6  84.8  84.7   Hematocrit 36.0 - 46.0 % 43.7  42.8  41.6   Platelets 150 - 400 K/uL 168  156  179       Latest Ref Rng & Units 10/23/2023    8:08 AM 10/25/2022    8:01 AM 10/22/2021    8:19 AM  CMP  Total Protein 6.5 - 8.1 g/dL 7.4  7.4  7.4   Total Bilirubin 0.0 - 1.2 mg/dL 1.1  0.8  0.8   Alkaline Phos 38 - 126 U/L 69  58  65   AST 15 - 41 U/L 21  17  20    ALT 0 - 44 U/L 17  15  17      Iron/TIBC/Ferritin/ %Sat    Component Value Date/Time   IRON 180 (H)  10/23/2023 0808   TIBC 297 10/23/2023 0808   FERRITIN 133 10/23/2023 0808   IRONPCTSAT 61 (H) 10/23/2023 0808      RADIOGRAPHIC STUDIES: I have personally reviewed the radiological images as listed and agreed with the findings in the report.  No results found.

## 2023-10-26 NOTE — Assessment & Plan Note (Signed)
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 JAK2 V617F mutation negative, with reflex to other mutations CALR, MPL, JAK 2 Ex 12-15 mutations negative. Hemoglobin slightly elevated.   Continue observation.

## 2023-11-09 ENCOUNTER — Inpatient Hospital Stay: Attending: Oncology

## 2023-11-09 MED ORDER — SODIUM CHLORIDE 0.9 % IV SOLN
Freq: Once | INTRAVENOUS | Status: AC
  Filled 2023-11-09: qty 250

## 2023-11-09 NOTE — Progress Notes (Signed)
 Holly Mcclain presents today for phlebotomy per MD orders. Phlebotomy procedure started at 1426 and ended at 1442. 500 mls removed. Patient tolerated procedure well. IV needle removed intact.

## 2023-11-09 NOTE — Patient Instructions (Signed)

## 2024-03-13 ENCOUNTER — Encounter: Payer: Self-pay | Admitting: Oncology

## 2024-03-14 ENCOUNTER — Ambulatory Visit: Payer: Self-pay

## 2024-03-14 DIAGNOSIS — Z09 Encounter for follow-up examination after completed treatment for conditions other than malignant neoplasm: Secondary | ICD-10-CM | POA: Diagnosis present

## 2024-03-14 DIAGNOSIS — K640 First degree hemorrhoids: Secondary | ICD-10-CM | POA: Diagnosis not present

## 2024-03-14 DIAGNOSIS — K5730 Diverticulosis of large intestine without perforation or abscess without bleeding: Secondary | ICD-10-CM | POA: Diagnosis not present

## 2024-03-14 DIAGNOSIS — Z860101 Personal history of adenomatous and serrated colon polyps: Secondary | ICD-10-CM | POA: Diagnosis not present

## 2024-10-21 ENCOUNTER — Other Ambulatory Visit

## 2024-10-24 ENCOUNTER — Encounter

## 2024-10-24 ENCOUNTER — Ambulatory Visit: Admitting: Oncology
